# Patient Record
Sex: Male | Born: 1945 | Hispanic: No | Marital: Single | Smoking: Former smoker
Health system: Southern US, Community
[De-identification: ages and names within clinical notes are randomized; demographics above are authoritative.]

## PROBLEM LIST (undated history)

## (undated) DIAGNOSIS — R531 Weakness: Secondary | ICD-10-CM

## (undated) DIAGNOSIS — IMO0001 Reserved for inherently not codable concepts without codable children: Secondary | ICD-10-CM

## (undated) DIAGNOSIS — G4730 Sleep apnea, unspecified: Secondary | ICD-10-CM

## (undated) DIAGNOSIS — I10 Essential (primary) hypertension: Secondary | ICD-10-CM

## (undated) HISTORY — DX: Essential (primary) hypertension: I10

## (undated) HISTORY — PX: KNEE SURGERY: SHX244

## (undated) HISTORY — PX: TONSILLECTOMY: SUR1361

## (undated) HISTORY — PX: GANGLION CYST EXCISION: SHX1691

---

## 2008-08-17 ENCOUNTER — Ambulatory Visit (HOSPITAL_COMMUNITY): Admission: EM | Admit: 2008-08-17 | Discharge: 2008-08-19 | Payer: Self-pay | Admitting: Emergency Medicine

## 2008-08-17 ENCOUNTER — Encounter (INDEPENDENT_AMBULATORY_CARE_PROVIDER_SITE_OTHER): Payer: Self-pay | Admitting: Internal Medicine

## 2010-04-09 LAB — HEMOGLOBIN A1C: Mean Plasma Glucose: 117 mg/dL

## 2010-04-09 LAB — COMPREHENSIVE METABOLIC PANEL
ALT: 17 U/L (ref 0–53)
AST: 26 U/L (ref 0–37)
Albumin: 3.1 g/dL — ABNORMAL LOW (ref 3.5–5.2)
Alkaline Phosphatase: 33 U/L — ABNORMAL LOW (ref 39–117)
BUN: 17 mg/dL (ref 6–23)
Chloride: 105 mEq/L (ref 96–112)
Potassium: 3.7 mEq/L (ref 3.5–5.1)
Sodium: 140 mEq/L (ref 135–145)
Total Bilirubin: 0.8 mg/dL (ref 0.3–1.2)

## 2010-04-09 LAB — T4, FREE: Free T4: 0.85 ng/dL (ref 0.80–1.80)

## 2010-04-09 LAB — CK TOTAL AND CKMB (NOT AT ARMC): Relative Index: 2.7 — ABNORMAL HIGH (ref 0.0–2.5)

## 2010-04-09 LAB — CBC
HCT: 38.5 % — ABNORMAL LOW (ref 39.0–52.0)
Hemoglobin: 13.1 g/dL (ref 13.0–17.0)
Platelets: 217 10*3/uL (ref 150–400)
RBC: 4.41 MIL/uL (ref 4.22–5.81)
WBC: 6.9 10*3/uL (ref 4.0–10.5)
WBC: 7.5 10*3/uL (ref 4.0–10.5)

## 2010-04-09 LAB — CARDIAC PANEL(CRET KIN+CKTOT+MB+TROPI): Relative Index: 3.2 — ABNORMAL HIGH (ref 0.0–2.5)

## 2010-04-09 LAB — RAPID URINE DRUG SCREEN, HOSP PERFORMED
Barbiturates: NOT DETECTED
Benzodiazepines: NOT DETECTED
Cocaine: NOT DETECTED

## 2010-04-09 LAB — MRSA PCR SCREENING: MRSA by PCR: NEGATIVE

## 2010-04-09 LAB — BASIC METABOLIC PANEL
Calcium: 8.7 mg/dL (ref 8.4–10.5)
Chloride: 104 mEq/L (ref 96–112)
Creatinine, Ser: 0.93 mg/dL (ref 0.4–1.5)
GFR calc Af Amer: >60 mL/min (ref >60)

## 2010-04-09 LAB — LIPID PANEL
LDL Cholesterol: 123 mg/dL — ABNORMAL HIGH (ref 0–99)
VLDL: 14 mg/dL (ref 0–40)

## 2010-04-09 LAB — GLUCOSE, CAPILLARY
Glucose-Capillary: 100 mg/dL — ABNORMAL HIGH (ref 70–99)
Glucose-Capillary: 128 mg/dL — ABNORMAL HIGH (ref 70–99)
Glucose-Capillary: 196 mg/dL — ABNORMAL HIGH (ref 70–99)
Glucose-Capillary: 83 mg/dL (ref 70–99)
Glucose-Capillary: 99 mg/dL (ref 70–99)

## 2010-04-09 LAB — VITAMIN B12: Vitamin B-12: 673 pg/mL (ref 211–911)

## 2010-04-09 LAB — TROPONIN I: Troponin I: 0.02 ng/mL (ref 0.00–0.06)

## 2010-04-09 LAB — HEPATIC FUNCTION PANEL
AST: 33 U/L (ref 0–37)
Albumin: 3.6 g/dL (ref 3.5–5.2)

## 2010-04-10 LAB — COMPREHENSIVE METABOLIC PANEL
ALT: 21 U/L (ref 0–53)
AST: 32 U/L (ref 0–37)
Alkaline Phosphatase: 40 U/L (ref 39–117)
CO2: 24 mEq/L (ref 19–32)
Chloride: 101 mEq/L (ref 96–112)
GFR calc Af Amer: >60 mL/min (ref >60)
GFR calc non Af Amer: >60 mL/min (ref >60)
Glucose, Bld: 109 mg/dL — ABNORMAL HIGH (ref 70–99)
Sodium: 132 mEq/L — ABNORMAL LOW (ref 135–145)
Total Bilirubin: 0.6 mg/dL (ref 0.3–1.2)

## 2010-04-10 LAB — DIFFERENTIAL
Basophils Absolute: 0.1 10*3/uL (ref 0.0–0.1)
Basophils Relative: 2 % — ABNORMAL HIGH (ref 0–1)
Eosinophils Absolute: 0.5 10*3/uL (ref 0.0–0.7)
Eosinophils Relative: 7 % — ABNORMAL HIGH (ref 0–5)
Neutrophils Relative %: 44 % (ref 43–77)

## 2010-04-10 LAB — CBC
HCT: 42.1 % (ref 39.0–52.0)
MCHC: 34.1 g/dL (ref 30.0–36.0)
MCV: 92.5 fL (ref 78.0–100.0)
Platelets: 255 10*3/uL (ref 150–400)
RBC: 4.55 MIL/uL (ref 4.22–5.81)
WBC: 7.9 10*3/uL (ref 4.0–10.5)

## 2010-04-10 LAB — PROTIME-INR
INR: 1 (ref 0.00–1.49)
Prothrombin Time: 13.1 seconds (ref 11.6–15.2)

## 2010-04-10 LAB — POCT CARDIAC MARKERS
CKMB, poc: 2.8 ng/mL (ref 1.0–8.0)
Myoglobin, poc: 75.4 ng/mL (ref 12–200)
Troponin i, poc: <0.05 ng/mL (ref 0.00–0.09)

## 2010-04-10 LAB — GLUCOSE, CAPILLARY: Glucose-Capillary: 104 mg/dL — ABNORMAL HIGH (ref 70–99)

## 2010-04-10 LAB — CK TOTAL AND CKMB (NOT AT ARMC): Total CK: 184 U/L (ref 7–232)

## 2010-05-17 NOTE — Consult Note (Signed)
NAME:  STRATTON, VILLWOCK NO.:  [id number]   MEDICAL RECORD NO.:  [id number]          PATIENT TYPE:  EMS   LOCATION:  MAJO                         FACILITY:  [facility]   PHYSICIAN:  Noel Christmas, MD    DATE OF BIRTH:  June 05, 1945   DATE OF CONSULTATION:  08/16/2008  DATE OF DISCHARGE:                                 CONSULTATION   REFERRING PHYSICIAN:  Azalia Bilis, MD, [facility].   REASON FOR CONSULTATION:  Rule out TIA versus stroke presenting as code  stroke.   HISTORY OF PRESENT ILLNESS:  This is a 65 year old man who experienced  onset of tingling around the left side of his mouth as well as a feeling  of weakness involving all 4 extremities.  At one point, he was unable to  stand.  His arms felt heavy as well.  There was no loss of  consciousness.  He denies feeling lightheaded, no dizzy.  There was no  headache.  No apparent change in mental status.  On arrival in the  emergency room, his findings were apparently nonfocal other than concern  about left upper extremity weakness.  CT scan of his head showed no  acute intracranial abnormality.  Blood pressure was markedly elevated  with systolic blood pressure of 240.  Symptoms completely cleared  following CT scan and returned to the emergency room.  There has been no  recurrence of symptoms.  At no time did he have preponderance of  weakness on one side compared to the other.   Past medical history is remarkable for hypertension, which was diagnosed  about 3 months ago.  He is on lisinopril 10 mg per day.   His family history is positive for hypertension but negative for stroke.   PHYSICAL EXAMINATION:  Appearance was that of a late middle-aged-  appearing man of medium build who was alert and cooperative in no acute  distress.  His mental status was normal.  Pupils, extraocular movements,  and visual fields were normal.  There was no facial weakness.  There was  also no residual facial  numbness.  Hearing was normal.  Speech and  palatal movement were normal.  Strength and muscle tone were normal  throughout.  Deep tendon reflexes were normal and symmetrical.  Plantar  responses were flexor.  Sensory examination was normal.  Coordination  was normal.  Carotid auscultation was normal.   CLINICAL IMPRESSION:  1. Unclear etiology for a period of generalized weakness.  The      patient's only focal symptoms was mild tingling around left side of      his mouth with no tingling elsewhere including left hand.  2. Transient ischemic attack is unlikely with no symptoms that could      be attributed to a specific cerebrovascular territory.   RECOMMENDATIONS:  1. Aspirin 81 mg per day.  2. Blood pressure control per Primary Care Service.  3. Neurology followup p.r.n.   Thank you for asking me to evaluate Mr. Muilenburg.      Noel Christmas, MD  Electronically Signed  CS/MEDQ  D:  08/16/2008  T:  08/17/2008  Job:  [id number]

## 2010-05-17 NOTE — Discharge Summary (Signed)
NAME:  Kenneth Dyer, Kenneth Dyer NO.:  [id number]   MEDICAL RECORD NO.:  [id number]          PATIENT TYPE:  INP   LOCATION:  2025                         FACILITY:  [facility]   PHYSICIAN:  Altha Harm, MDDATE OF BIRTH:  05/26/1945   DATE OF ADMISSION:  08/16/2008  DATE OF DISCHARGE:  08/19/2008                               DISCHARGE SUMMARY   DISCHARGE DISPOSITION:  Home.   FINAL DISCHARGE DIAGNOSES:  1. Malignant hypertension, resolved.  2. Sinus bradycardia, asymptomatic.  3. Hypertension.  4. Hyperlipidemia.  5. Anxiety.   DISCHARGE MEDICATIONS:  1. Lisinopril 10 mg p.o. daily.  2. Hydrochlorothiazide 12.5 mg p.o. daily.  3. Zocor 20 mg p.o. q.h.s.   CONSULTANTS:  Neurology.   PROCEDURE:  None.   DIAGNOSTIC STUDIES:  1. CT of the head without contrast done on admission which shows no      acute intracranial abnormality.  2. Portable chest x-ray one-view done on admission which shows      cardiomegaly.  3. CT angiogram of the chest done on August 16, 2008 which shows no      acute abnormality in the chest.  Impression:  Old granulomatous      disease.  4. 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  5. Bilateral carotid duplex which shows no ICA stenosis bilaterally.  6. A 2-D echocardiogram which shows left ventricular cavity size      normal and systolic function vigorous with an ejection fraction of      65-70%.  No diagnostic regional wall motion abnormality identified      and trivial regurgitation noted at the aortic valve.   CODE STATUS:  Full code.   ALLERGIES:  NO KNOWN DRUG ALLERGIES.   PRIMARY CARE PHYSICIAN:  The patient's physician is in [place].   CHIEF COMPLAINT:  Numbness in both arms and legs.   HISTORY OF PRESENT ILLNESS:  Please  refer to the H and P by Dr. Della Goo for details of the HPI.  However in short, this is a gentleman  who presents to the emergency department with complaints of heaviness  and numbness in both arms and legs.  The patient was found to have a  blood pressure of 224/104 in the emergency room.   HOSPITAL COURSE:  1. Malignant hypertension.  The patient was started on IV labetalol      and admitted to the ICU.  The patient's blood pressure improved and      he was transferred over to p.o. metoprolol.  The patient was also      started back on his usual medication of lisinopril.  This rendered      the patient's blood pressure as low as 109 systolic and heart rate      down into the 40s.  I felt that the patient's blood pressure was  lowered excessively with this dose of metoprolol, although it was a      small dose, the metoprolol was discontinued and the patient      observed for 24 hours.  The patient has a blood pressure now      ranging 149/91 with heart rates in the 60s.  The patient does have      bradycardia down into the high 40s and low 50s when sleeping.      However, this is completely asymptomatic.  Thus the patient is      being sent home with the medications as noted above.      Hydrochlorothiazide has been added for natruresis to aid in control      of blood pressure.  His medications will need to be further      titrated as an outpatient.  2. Hyperlipidemia.  The patient was found to have elevated lipids with      an LDL of 123.  He has been started on Zocor 20 mg p.o. q.h.s.  3. Sinus bradycardia.  As noted above, I felt that the patient likely      has underlying  sinus bradycardia.  He states that he exercises regularly and is known  to have heart rates down into the 50s and 40s.  This was likely  compounded by the use of a beta blocker.  The beta blocker has been  discontinued and the patient is asymptomatic with his bradycardia.  I  will ask the patient to  follow up with his primary care physician within  a week for evaluation of his heart rate and bradycardia.   DIETARY RESTRICTIONS:  The patient should be on a 2 gm sodium, low  cholesterol diet.   ACTIVITY RESTRICTIONS:  None.   FOLLOW UP:  The patient to follow up with his primary care physician in  1 week in [place].   Total time for this discharge 47 minutes.      Altha Harm, MD  Electronically Signed     MAM/MEDQ  D:  08/19/2008  T:  08/19/2008  Job:  [id number]

## 2010-05-17 NOTE — H&P (Signed)
NAME:  Kenneth Dyer, Kenneth Dyer NO.:  [id number]   MEDICAL RECORD NO.:  [id number]          PATIENT TYPE:  INP   LOCATION:  2308                         FACILITY:  [facility]   PHYSICIAN:  Della Goo, M.D. DATE OF BIRTH:  September 17, 1945   DATE OF ADMISSION:  08/16/2008  DATE OF DISCHARGE:                              HISTORY & PHYSICAL   PRIMARY CARE PHYSICIAN:  Unassigned.   CHIEF COMPLAINT:  Numbness in both arms and legs.   HISTORY OF PRESENT ILLNESS:  This is a 65 year old male who presents to  the emergency department secondary to complaints of heaviness and  numbness in both legs and in both arms which started at about 8 p.m..  The patient reports being weak and being unable to walk.  He was at an  area restaurant when this occurred.  The patient also reported having  numbness and tingling of his mouth area.  Emergency medical services  were called and the patient was transported to the emergency department  at [facility].  On arrival. the patient was found to have an  elevated blood pressure of 224/104.  He was administered labetalol IV  and placed on a labetalol drip.  His blood pressures improved.  The  patient reports having a history of hypertension and he reports taking  lisinopril therapy.  The patient denies having any chest pain, shortness  of breath.  Denies having any nausea, vomiting or diaphoresis.  He  reports the sensation of numbness and heaviness lasted about an hour and  a half, resolved while he was in the emergency department.  The patient  was initially evaluated by the neurologist as a Code Stroke.  This was  ruled out and the patient was referred for medical admission.  A CT scan  of the head had been performed which revealed no acute intracranial  abnormality.  A CT angiogram of the chest was also performed to evaluate  for possible dissection secondary to the patient's symptoms.  The CT  angiogram of the chest was negative for an  aneurysm or a dissection.   PAST MEDICAL HISTORY:  Hypertension.   PAST SURGICAL HISTORY:  Tonsillectomy.   MEDICATIONS:  Lisinopril 10 mg one p.o. daily   ALLERGIES:  No known drug allergies.   SOCIAL HISTORY:  The patient lives alone.  He is a nonsmoker,  nondrinker.  He denies any illicit drug usage.   FAMILY HISTORY:  Positive for hypertension in his father, positive for  skin cancer in his father.  No history of coronary artery disease or  diabetes in the family that he knows of.  He states his mother had  rheumatoid arthritis.   REVIEW OF SYSTEMS:  Pertinents are mentioned above in the HPI.   PHYSICAL EXAMINATION:  FINDINGS:  This is a 65 year old well-nourished,  well-developed male in no visible discomfort or acute distress.  VITAL SIGNS:  Temperature 98.3, blood pressure initially 224/104, heart  rate 74, respirations 12, O2 saturation 100%.  HEENT EXAMINATION:  Normocephalic, atraumatic.  Pupils equally round,  reactive to light.  Extraocular movements are intact.  Funduscopic  benign.  There is no scleral icterus.  Nares are patent bilaterally.  Oropharynx is clear.  NECK:  Supple, full range of motion.  No thyromegaly, adenopathy,  jugular venous distention.  CARDIOVASCULAR:  Regular rate and rhythm.  No murmurs, gallops or rubs.  LUNGS: Clear to auscultation bilaterally.  ABDOMEN: Positive bowel sounds, soft, nontender, nondistended.  EXTREMITIES: Without cyanosis, clubbing or edema.  NEUROLOGIC EXAMINATION:  The patient is alert and oriented x3.  His  speech is clear.  His cranial nerves are intact.  Motor and sensory  function also intact.  He has full range of motion of all of his  extremities and his strength is 5/5 throughout.  His cerebellar function  is intact.  Gait has not been assessed.  There are no focal deficits on  examination at this time.   LABORATORY STUDIES:  Protime 13.1, INR 1.0, PTT 25.  White blood cell  count 7.9, hemoglobin 14.4,  hematocrit 42.1, MCV 92.5, platelets 255,  neutrophils 44%, lymphocytes 39%, glucose 104.  Point of care cardiac  markers with a myoglobin of 75.4, CK-MB 2.8, troponin less than 0.05.  CT scan of the head results as mentioned above.  CT angiogram study of  the chest mentioned above in the HPI as well.  A portable chest x-ray  reveals mild cardiomegaly, no airspace disease or effusion.  The  mediastinum is within normal limits.  Mild left basilar atelectasis is  present.  EKG reveals normal sinus rhythm.  No acute ST-segment changes  seen.   ASSESSMENT:  A 65 year old male being admitted with:  1. Hypertensive urgency.  2. Transient ischemic attack.  3. Weakness.   PLAN:  The patient will be continued on the IV labetalol drip to control  as blood pressure at this time.  Cardiac enzymes will be performed and  the patient will be sent for an MRI/MRA study and carotid ultrasound  study.  DVT and GI prophylaxis have been ordered.  Further workup will  ensue pending results of the patient's clinical course and results of  his studies.      Della Goo, M.D.  Electronically Signed     HJ/MEDQ  D:  08/17/2008  T:  08/17/2008  Job:  [id number]

## 2013-05-19 ENCOUNTER — Ambulatory Visit (HOSPITAL_BASED_OUTPATIENT_CLINIC_OR_DEPARTMENT_OTHER): Payer: Medicare HMO | Attending: Internal Medicine

## 2013-05-19 VITALS — Ht 70.0 in | Wt 205.0 lb

## 2013-05-19 DIAGNOSIS — G4730 Sleep apnea, unspecified: Secondary | ICD-10-CM

## 2013-05-19 DIAGNOSIS — G4733 Obstructive sleep apnea (adult) (pediatric): Secondary | ICD-10-CM | POA: Insufficient documentation

## 2013-05-19 DIAGNOSIS — G4710 Hypersomnia, unspecified: Secondary | ICD-10-CM

## 2013-05-24 DIAGNOSIS — G4730 Sleep apnea, unspecified: Secondary | ICD-10-CM

## 2013-05-24 DIAGNOSIS — G4710 Hypersomnia, unspecified: Secondary | ICD-10-CM

## 2013-05-24 NOTE — Sleep Study (Signed)
   NAME: Kenneth Dyer DATE OF BIRTH:  06/08/1945 MEDICAL RECORD NUMBER [id number]  LOCATION: [facility]  PHYSICIAN: Stassi Fadely D Puneet Selden  DATE OF STUDY: 05/19/2013  SLEEP STUDY TYPE: Nocturnal Polysomnogram               REFERRING PHYSICIAN: Jetty Duhamel D, MD  INDICATION FOR STUDY: Hypersomnia with sleep apnea  EPWORTH SLEEPINESS SCORE:   8/24 HEIGHT: 5\' 10"  (177.8 cm)  WEIGHT: 205 lb (92.987 kg)    Body mass index is 29.41 kg/(m^2).  NECK SIZE: 15 in.  MEDICATIONS: Charted for review  SLEEP ARCHITECTURE: Split study protocol. During the diagnostic phase, total sleep time 129.5 minutes with sleep efficiency 90.6%. Stage I was 2.3%, stage II 97.7%, stage III and REM were absent. Sleep latency 1.5 minutes, awake after sleep onset 11.5 minutes, arousal index 0.9. Bedtime medication: None  RESPIRATORY DATA: Apnea hypopnea index (AHI) 67.2 per hour. 145 total events scored including 85 obstructive apneas, 1 central apnea, 59 hypopneas. Events were associated with supine sleep position. CPAP was titrated to 12 CWP, AHI 0 per hour. He wore a medium Simplus fullface mask with heated humidifier.  OXYGEN DATA: Loud snoring before CPAP with oxygen desaturation to a nadir of 84% on room air. After CPAP, snoring was prevented and mean oxygen saturation of 94.8% on room air.  CARDIAC DATA: Sinus rhythm with occasional PAC  MOVEMENT/PARASOMNIA: No significant movement disturbance, bathroom x3  IMPRESSION/ RECOMMENDATION:   1) Severe obstructive sleep apnea/hypopneas syndrome, AHI 67.2 per hour, supine events. Loud snoring with oxygen desaturation to a nadir of 84% on room air. 2) Successful CPAP titration to 12 CWP, AHI 0 per hour. He wore a medium [employer] Simplus fullface mask with heated humidifier. Snoring was prevented and mean oxygen saturation of 94.8% on room air.   Signed Jetty Duhamel M.D. Waymon Budge Diplomate, Biomedical engineer of Sleep  Medicine  ELECTRONICALLY SIGNED ON:  05/24/2013, 2:56 PM [name] PH: [phone]   FX: [phone] ACCREDITED BY THE AMERICAN ACADEMY OF SLEEP MEDICINE

## 2013-07-29 ENCOUNTER — Encounter: Payer: Self-pay | Admitting: Pulmonary Disease

## 2013-07-29 ENCOUNTER — Ambulatory Visit (INDEPENDENT_AMBULATORY_CARE_PROVIDER_SITE_OTHER): Payer: Medicare HMO | Admitting: Pulmonary Disease

## 2013-07-29 ENCOUNTER — Encounter (INDEPENDENT_AMBULATORY_CARE_PROVIDER_SITE_OTHER): Payer: Self-pay

## 2013-07-29 VITALS — BP 110/68 | HR 61 | Temp 98.0°F | Ht 70.0 in | Wt 205.6 lb

## 2013-07-29 DIAGNOSIS — G4733 Obstructive sleep apnea (adult) (pediatric): Secondary | ICD-10-CM | POA: Insufficient documentation

## 2013-07-29 NOTE — Progress Notes (Signed)
Subjective:    Patient ID: Kenneth Dyer, male    DOB: 03/29/1945, 68 y.o.   MRN: [id number]  HPI  68 year old man presents for evaluation of sleep apnea. [place]CP is in [place], and he is trying to find a new provider in [place]. He reports chronic a.m. Headaches, for which he is required BurtonGoody powder for many years. He reports difficulty breathing when he is sleeping and over the years he started sleeping on the couch, and then with 4 pillows. He shared a room recently in a hotel with a roommate and was told about his loud snoring and gasping episodes during sleep. Epworth sleepiness score is 7 Bedtime is around 11 PM, sleep latency is minimal, he sleeps on his back with 4 pillows, reports frequent nocturnal awakenings especially if his sinuses bother him, occasional bathroom visits, wakes up around 7 AM with a headache and extreme dryness of mouth, he feels like he is in a fog for about 30 minutes. He a tonsillectomy as a child. There is no history suggestive of cataplexy, sleep paralysis or parasomnias Polysomnogram with weight 205 pounds and BMI 29 showed AHI 67 events per hour with lowest desaturation to 84%. CPAP was titrated to 12 centimeters with a medium fullface mask to abolish  Snoring. He felt claustrophobic and did not feel rested after this night.  Past Medical History  Diagnosis Date  . Hypertension     Past Surgical History  Procedure Laterality Date  . Knee surgery      right   . Ganglion cyst excision      right wrist  . Tonsillectomy      No Known Allergies  History   Social History  . Marital Status: Single    Spouse Name: N/A    Number of Children: N/A  . Years of Education: N/A   Occupational History  . retired    Social History Main Topics  . Smoking status: Former Smoker -- 1.00 packs/day for 15 years    Types: Cigarettes    Quit date: 01/03/1983  . Smokeless tobacco: Not on file  . Alcohol Use: No  . Drug Use: No  . Sexual Activity: Not  on file   Other Topics Concern  . Not on file   Social History Narrative  . No narrative on file    Family History  Problem Relation Age of Onset  . Hypertension Father   . Skin cancer Father   . Arthritis Mother        Review of Systems  Constitutional: Negative for fever and unexpected weight change.  HENT: Negative for congestion, dental problem, ear pain, nosebleeds, postnasal drip, rhinorrhea, sinus pressure, sneezing, sore throat and trouble swallowing.   Eyes: Negative for redness and itching.  Respiratory: Positive for shortness of breath. Negative for cough, chest tightness and wheezing.   Cardiovascular: Negative for palpitations and leg swelling.  Gastrointestinal: Negative for nausea and vomiting.  Genitourinary: Negative for dysuria.  Musculoskeletal: Negative for joint swelling.  Skin: Negative for rash.  Neurological: Negative for headaches.  Hematological: Does not bruise/bleed easily.  Psychiatric/Behavioral: Negative for dysphoric mood. The patient is not nervous/anxious.        Objective:   Physical Exam  Gen. Pleasant, well-nourished, in no distress, normal affect ENT - no lesions, no post nasal drip Neck: No JVD, no thyromegaly, no carotid bruits Lungs: no use of accessory muscles, no dullness to percussion, clear without rales or rhonchi  Cardiovascular: Rhythm regular, heart sounds  normal, no murmurs or gallops, no peripheral edema Abdomen: soft and non-tender, no hepatosplenomegaly, BS normal. Musculoskeletal: No deformities, no cyanosis or clubbing Neuro:  alert, non focal       Assessment & Plan:

## 2013-07-29 NOTE — Assessment & Plan Note (Signed)
The pathophysiology of obstructive sleep apnea , it's cardiovascular consequences & modes of treatment including CPAP were discused with the patient in detail & they evidenced understanding.  Weight loss encouraged, compliance with goal of at least 4-6 hrs every night is the expectation. Advised against medications with sedative side effects Cautioned against driving when sleepy - understanding that sleepiness will vary on a day to day basis Set up CPAP 12 cm, mask of choice, download in 4 wks

## 2013-07-29 NOTE — Patient Instructions (Signed)
You have severe obstructive sleep apnea We will set you up with a cpap machine

## 2013-08-13 ENCOUNTER — Telehealth: Payer: Self-pay | Admitting: Pulmonary Disease

## 2013-08-13 DIAGNOSIS — G4733 Obstructive sleep apnea (adult) (pediatric): Secondary | ICD-10-CM

## 2013-08-13 NOTE — Telephone Encounter (Signed)
Pt calling again Spoke with patient who verified that he would like his [facility] than [facility], where the order ws originally sent on 7.28.15.  Advised pt order will be placed.  Nothing further needed at this time; will sign off.

## 2013-08-13 NOTE — Telephone Encounter (Signed)
Pt returning [name] ° °

## 2013-08-13 NOTE — Telephone Encounter (Signed)
Order 07/29/13 by RA was faxed to [facility]. Called pt home # [id number] x1 for pt to confirm message

## 2013-09-10 ENCOUNTER — Ambulatory Visit: Payer: Medicare HMO | Admitting: Pulmonary Disease

## 2013-10-09 ENCOUNTER — Ambulatory Visit: Payer: Medicare HMO | Admitting: Pulmonary Disease

## 2013-10-10 ENCOUNTER — Ambulatory Visit (INDEPENDENT_AMBULATORY_CARE_PROVIDER_SITE_OTHER): Payer: Medicare HMO | Admitting: Pulmonary Disease

## 2013-10-10 ENCOUNTER — Encounter: Payer: Self-pay | Admitting: Pulmonary Disease

## 2013-10-10 VITALS — BP 118/78 | HR 68 | Temp 97.2°F | Ht 70.0 in | Wt 212.8 lb

## 2013-10-10 DIAGNOSIS — G4733 Obstructive sleep apnea (adult) (pediatric): Secondary | ICD-10-CM

## 2013-10-10 NOTE — Assessment & Plan Note (Signed)
Your CPAP is set at 12 cm You are adjusting well compliance with goal of at least 6 hrs every night is the expectation. Advised against medications with sedative side effects Cautioned against driving when sleepy - understanding that sleepiness will vary on a day to day basis

## 2013-10-10 NOTE — Patient Instructions (Addendum)
Your CPAP is set at 12 cm You are adjusting well compliance with goal of at least 6 hrs every night is the expectation. Advised against medications with sedative side effects Cautioned against driving when sleepy - understanding that sleepiness will vary on a day to day basis 

## 2013-10-10 NOTE — Progress Notes (Signed)
   Subjective:    Patient ID: Lezlie LyeJames Farner, male    DOB: 09/19/1945, 68 y.o.   MRN: [id number]  HPI  68 year old man for FU of OSA.  He presented with loud snoring and gasping episodes during sleep.  Epworth sleepiness score is 7  He had a tonsillectomy as a child.   Polysomnogram with weight 205 pounds and BMI 29 showed AHI 67 events per hour with lowest desaturation to 84%. CPAP was titrated to 12 centimeters with a medium fullface mask to abolish Snoring. He felt claustrophobic and did not feel rested after this night.  Download 09/2013 - AHI 7/h on 12 cm, leak ok, good usage avg 6h  Feels rested, CPAP helping, has good energy  Review of Systems neg for any significant sore throat, dysphagia, itching, sneezing, nasal congestion or excess/ purulent secretions, fever, chills, sweats, unintended wt loss, pleuritic or exertional cp, hempoptysis, orthopnea pnd or change in chronic leg swelling. Also denies presyncope, palpitations, heartburn, abdominal pain, nausea, vomiting, diarrhea or change in bowel or urinary habits, dysuria,hematuria, rash, arthralgias, visual complaints, headache, numbness weakness or ataxia.     Objective:   Physical Exam  Gen. Pleasant, well-nourished, in no distress ENT - no lesions, no post nasal drip Neck: No JVD, no thyromegaly, no carotid bruits Lungs: no use of accessory muscles, no dullness to percussion, clear without rales or rhonchi  Cardiovascular: Rhythm regular, heart sounds  normal, no murmurs or gallops, no peripheral edema Musculoskeletal: No deformities, no cyanosis or clubbing        Assessment & Plan:

## 2014-01-20 ENCOUNTER — Telehealth: Payer: Self-pay | Admitting: Pulmonary Disease

## 2014-01-20 NOTE — Telephone Encounter (Signed)
Called and spoke to pt. Pt is needing CPAP supplies but stated he is unable to due to the [facility]. [facility] and [facility] and was informed when the pt changed DME companies [facility] in [facility] no documents were sent to [facility] (OV notes, sleep study, etc). [facility] stated they have the order for pt's CPAP settings but nothing else.  PCC's please advise.

## 2014-01-20 NOTE — Telephone Encounter (Signed)
i don't know what this pt can be talking about [facility] has access to EPIC they can get what they need i guess we need to get melissa involved in this Tobe SosSally E Ottinger

## 2014-01-21 NOTE — Telephone Encounter (Signed)
Staff [facility]. Advised pt that we would get this handled for him.

## 2014-01-22 NOTE — Telephone Encounter (Signed)
Spoke with pt and informed that [facility] needed and should be sending out cpap supplies.

## 2014-01-22 NOTE — Telephone Encounter (Signed)
ATC pt NA wcb 

## 2014-01-22 NOTE — Telephone Encounter (Signed)
Return [name] °

## 2014-01-22 NOTE — Telephone Encounter (Signed)
[facility] and she states that shew as able to get the info she needed and forwarded this to the cpap supply team.  She will check with them to see if they have sent supplies out and pt should be receiving them. ATC pt to inform him of this but NA - Will try back later.

## 2014-01-22 NOTE — Telephone Encounter (Signed)
Pt retunred call [phone]

## 2014-02-12 ENCOUNTER — Encounter: Payer: Self-pay | Admitting: Adult Health

## 2014-02-12 ENCOUNTER — Encounter (INDEPENDENT_AMBULATORY_CARE_PROVIDER_SITE_OTHER): Payer: Self-pay

## 2014-02-12 ENCOUNTER — Ambulatory Visit (INDEPENDENT_AMBULATORY_CARE_PROVIDER_SITE_OTHER): Payer: Medicare HMO | Admitting: Adult Health

## 2014-02-12 VITALS — BP 144/84 | HR 63 | Temp 98.4°F | Ht 70.0 in | Wt 216.0 lb

## 2014-02-12 DIAGNOSIS — G4733 Obstructive sleep apnea (adult) (pediatric): Secondary | ICD-10-CM

## 2014-02-12 NOTE — Patient Instructions (Signed)
Continue on C Pap at bedtime Continue to work on weight loss Do not drive when sleepy Follow-up with Dr. Vassie LollAlva in 1 year and as needed

## 2014-02-12 NOTE — Progress Notes (Signed)
   Subjective:    Patient ID: Kenneth Dyer, male    DOB: 11/13/1945, 69 y.o.   MRN: [id number]  HPI 69 year old man for FU of OSA.  He presented with loud snoring and gasping episodes during sleep.  Epworth sleepiness score is 7  He had a tonsillectomy as a child.   Polysomnogram with weight 205 pounds and BMI 29 showed AHI 67 events per hour with lowest desaturation to 84%. CPAP was titrated to 12 centimeters with a medium fullface mask to abolish Snoring. He felt claustrophobic and did not feel rested after this night.  Download 09/2013 - AHI 7/h on 12 cm, leak ok, good usage avg 6h  Feels rested, CPAP helping, has good energy  02/12/2014 follow-up sleep apnea Patient returns for follow-up of underlying severe sleep apnea He was started on C Pap in August 2015 Has been doing very well on C Pap he wears it every night. Says he has good control with no daytime sleepiness. Download October 13 2 February 19 shows excellent compliance with 100% usage with average of 7.5 hours on 12 cm, AHI  4.7, leaks okay Patient denies any chest pain, palpitations, extremity swelling or shortness of breath.  Review of Systems  neg for any significant sore throat, dysphagia, itching, sneezing, nasal congestion or excess/ purulent secretions, fever, chills, sweats, unintended wt loss, pleuritic or exertional cp, hempoptysis, orthopnea pnd or change in chronic leg swelling. Also denies presyncope, palpitations, heartburn, abdominal pain, nausea, vomiting, diarrhea or change in bowel or urinary habits, dysuria,hematuria, rash, arthralgias, visual complaints, headache, numbness weakness or ataxia.     Objective:   Physical Exam   Gen. Pleasant, well-nourished, in no distress ENT - no lesions, no post nasal drip Neck: No JVD, no thyromegaly, no carotid bruits Lungs: no use of accessory muscles, no dullness to percussion, clear without rales or rhonchi  Cardiovascular: Rhythm regular, heart sounds  normal,  no murmurs or gallops, no peripheral edema Musculoskeletal: No deformities, no cyanosis or clubbing        Assessment & Plan:

## 2014-02-12 NOTE — Assessment & Plan Note (Signed)
Sleep apnea, controlled on C Pap He is encouraged on weight loss Advised to not drive if sleepy Follow up in one year with Dr. Vassie LollAlva and as needed

## 2014-02-14 NOTE — Progress Notes (Signed)
Reviewed & agree with plan  

## 2014-03-25 ENCOUNTER — Telehealth: Payer: Self-pay | Admitting: Pulmonary Disease

## 2014-03-25 DIAGNOSIS — G4733 Obstructive sleep apnea (adult) (pediatric): Secondary | ICD-10-CM

## 2014-03-25 NOTE — Telephone Encounter (Signed)
Patient would like to try the nasal mask.  Ok to send order?

## 2014-03-26 NOTE — Telephone Encounter (Signed)
ok 

## 2014-03-26 NOTE — Telephone Encounter (Signed)
Order entered. Pt notified. Nothing further needed.

## 2014-10-22 DIAGNOSIS — I10 Essential (primary) hypertension: Secondary | ICD-10-CM | POA: Diagnosis not present

## 2014-11-18 DIAGNOSIS — H52223 Regular astigmatism, bilateral: Secondary | ICD-10-CM | POA: Diagnosis not present

## 2014-11-18 DIAGNOSIS — H04123 Dry eye syndrome of bilateral lacrimal glands: Secondary | ICD-10-CM | POA: Diagnosis not present

## 2014-11-18 DIAGNOSIS — H524 Presbyopia: Secondary | ICD-10-CM | POA: Diagnosis not present

## 2014-11-18 DIAGNOSIS — H5203 Hypermetropia, bilateral: Secondary | ICD-10-CM | POA: Diagnosis not present

## 2014-11-18 DIAGNOSIS — H25813 Combined forms of age-related cataract, bilateral: Secondary | ICD-10-CM | POA: Diagnosis not present

## 2015-02-17 ENCOUNTER — Ambulatory Visit (INDEPENDENT_AMBULATORY_CARE_PROVIDER_SITE_OTHER): Payer: Medicare HMO | Admitting: Pulmonary Disease

## 2015-02-17 ENCOUNTER — Encounter: Payer: Self-pay | Admitting: Pulmonary Disease

## 2015-02-17 VITALS — BP 124/82 | HR 71 | Ht 70.0 in | Wt 214.4 lb

## 2015-02-17 DIAGNOSIS — G4733 Obstructive sleep apnea (adult) (pediatric): Secondary | ICD-10-CM | POA: Diagnosis not present

## 2015-02-17 NOTE — Assessment & Plan Note (Signed)
CPAP supplies will be renewed for a year Trial of new nasal mask Your CPAP is set at 12 cm  Weight loss encouraged, compliance with goal of at least 4-6 hrs every night is the expectation. Advised against medications with sedative side effects Cautioned against driving when sleepy - understanding that sleepiness will vary on a day to day basis   Greater than 50% time was spent in counseling and coordination of care with the patient

## 2015-02-17 NOTE — Patient Instructions (Signed)
CPAP supplies will be renewed for a year Trial of new nasal mask Your CPAP is set at 12 cm

## 2015-02-17 NOTE — Progress Notes (Signed)
   Subjective:    Patient ID: Kenneth Dyer, male    DOB: February 01, 1945, 70 y.o.   MRN: [id number]  HPI  70 year old man for FU of severe OSA.   PSG - weight 205 pounds and BMI 29 showed AHI 67 events per hour with lowest desaturation to 84%. CPAP was titrated to 12 centimeters with a medium fullface mask    Download 02/2014 shows excellent compliance with 100% usage with average of 7.5 hours on 12 cm, AHI  4.7,  02/17/2015  Chief Complaint  Patient presents with  . Follow-up    doing well on CPAP.  received a new mask few weeks ago, doing well on new mask. Discuss mask and headgear options.   Feels rested, CPAP helping, has good energy He was started on C Pap in August 2015 Has been doing very well on C Pap he wears it every night.   no daytime sleepiness.  Download 02/2015 reviewed on 12 cm-excellent usage more than 8 hours, residue AHI 5.6/hour, mild leak  Review of Systems neg for any significant sore throat, dysphagia, itching, sneezing, nasal congestion or excess/ purulent secretions, fever, chills, sweats, unintended wt loss, pleuritic or exertional cp, hempoptysis, orthopnea pnd or change in chronic leg swelling.  Also denies presyncope, palpitations, heartburn, abdominal pain, nausea, vomiting, diarrhea or change in bowel or urinary habits, dysuria,hematuria, rash, arthralgias, visual complaints, headache, numbness weakness or ataxia.     Objective:   Physical Exam  Gen. Pleasant, well-nourished, in no distress ENT - no lesions, no post nasal drip Neck: No JVD, no thyromegaly, no carotid bruits Lungs: no use of accessory muscles, no dullness to percussion, clear without rales or rhonchi  Cardiovascular: Rhythm regular, heart sounds  normal, no murmurs or gallops, no peripheral edema Musculoskeletal: No deformities, no cyanosis or clubbing        Assessment & Plan:

## 2015-02-24 ENCOUNTER — Encounter: Payer: Self-pay | Admitting: Pulmonary Disease

## 2015-03-05 ENCOUNTER — Telehealth: Payer: Self-pay | Admitting: Pulmonary Disease

## 2015-03-05 DIAGNOSIS — G4733 Obstructive sleep apnea (adult) (pediatric): Secondary | ICD-10-CM

## 2015-03-05 NOTE — Telephone Encounter (Signed)
Spoke with pt. [facility] is [facility]. Order has been placed. Nothing further was needed at this time.

## 2015-03-17 DIAGNOSIS — G4733 Obstructive sleep apnea (adult) (pediatric): Secondary | ICD-10-CM | POA: Diagnosis not present

## 2015-03-25 DIAGNOSIS — L578 Other skin changes due to chronic exposure to nonionizing radiation: Secondary | ICD-10-CM | POA: Diagnosis not present

## 2015-03-25 DIAGNOSIS — L570 Actinic keratosis: Secondary | ICD-10-CM | POA: Diagnosis not present

## 2015-06-03 DIAGNOSIS — IMO0001 Reserved for inherently not codable concepts without codable children: Secondary | ICD-10-CM

## 2015-06-03 DIAGNOSIS — R531 Weakness: Secondary | ICD-10-CM

## 2015-06-03 HISTORY — DX: Weakness: R53.1

## 2015-06-03 HISTORY — DX: Reserved for inherently not codable concepts without codable children: IMO0001

## 2015-06-23 DIAGNOSIS — E785 Hyperlipidemia, unspecified: Secondary | ICD-10-CM | POA: Diagnosis not present

## 2015-06-23 DIAGNOSIS — I10 Essential (primary) hypertension: Secondary | ICD-10-CM | POA: Diagnosis not present

## 2015-06-23 DIAGNOSIS — R0602 Shortness of breath: Secondary | ICD-10-CM | POA: Diagnosis not present

## 2015-06-23 DIAGNOSIS — Z13220 Encounter for screening for lipoid disorders: Secondary | ICD-10-CM | POA: Diagnosis not present

## 2015-06-23 DIAGNOSIS — R748 Abnormal levels of other serum enzymes: Secondary | ICD-10-CM | POA: Diagnosis not present

## 2015-06-29 DIAGNOSIS — R0602 Shortness of breath: Secondary | ICD-10-CM | POA: Diagnosis not present

## 2015-06-30 ENCOUNTER — Observation Stay (HOSPITAL_COMMUNITY)
Admission: EM | Admit: 2015-06-30 | Discharge: 2015-07-01 | Disposition: A | Discharge status: Home / Self Care | Payer: Medicare HMO | Attending: Cardiovascular Disease | Admitting: Cardiovascular Disease

## 2015-06-30 ENCOUNTER — Observation Stay (HOSPITAL_COMMUNITY): Payer: Medicare HMO

## 2015-06-30 ENCOUNTER — Encounter (HOSPITAL_COMMUNITY): Payer: Self-pay | Admitting: Emergency Medicine

## 2015-06-30 ENCOUNTER — Emergency Department (HOSPITAL_COMMUNITY): Payer: Medicare HMO

## 2015-06-30 DIAGNOSIS — G4733 Obstructive sleep apnea (adult) (pediatric): Secondary | ICD-10-CM | POA: Insufficient documentation

## 2015-06-30 DIAGNOSIS — J8410 Pulmonary fibrosis, unspecified: Secondary | ICD-10-CM | POA: Insufficient documentation

## 2015-06-30 DIAGNOSIS — J449 Chronic obstructive pulmonary disease, unspecified: Secondary | ICD-10-CM | POA: Diagnosis not present

## 2015-06-30 DIAGNOSIS — R072 Precordial pain: Principal | ICD-10-CM | POA: Insufficient documentation

## 2015-06-30 DIAGNOSIS — I249 Acute ischemic heart disease, unspecified: Secondary | ICD-10-CM | POA: Diagnosis not present

## 2015-06-30 DIAGNOSIS — R0602 Shortness of breath: Secondary | ICD-10-CM | POA: Diagnosis not present

## 2015-06-30 DIAGNOSIS — E785 Hyperlipidemia, unspecified: Secondary | ICD-10-CM | POA: Insufficient documentation

## 2015-06-30 DIAGNOSIS — K219 Gastro-esophageal reflux disease without esophagitis: Secondary | ICD-10-CM | POA: Insufficient documentation

## 2015-06-30 DIAGNOSIS — E6609 Other obesity due to excess calories: Secondary | ICD-10-CM | POA: Diagnosis not present

## 2015-06-30 DIAGNOSIS — I2510 Atherosclerotic heart disease of native coronary artery without angina pectoris: Secondary | ICD-10-CM | POA: Insufficient documentation

## 2015-06-30 DIAGNOSIS — Z7982 Long term (current) use of aspirin: Secondary | ICD-10-CM | POA: Diagnosis not present

## 2015-06-30 DIAGNOSIS — E669 Obesity, unspecified: Secondary | ICD-10-CM | POA: Insufficient documentation

## 2015-06-30 DIAGNOSIS — I10 Essential (primary) hypertension: Secondary | ICD-10-CM | POA: Insufficient documentation

## 2015-06-30 DIAGNOSIS — Z87891 Personal history of nicotine dependence: Secondary | ICD-10-CM | POA: Diagnosis not present

## 2015-06-30 HISTORY — DX: Sleep apnea, unspecified: G47.30

## 2015-06-30 HISTORY — DX: Weakness: R53.1

## 2015-06-30 HISTORY — DX: Reserved for inherently not codable concepts without codable children: IMO0001

## 2015-06-30 LAB — CBC WITH DIFFERENTIAL/PLATELET
Basophils Absolute: 0.1 10*3/uL (ref 0.0–0.1)
Basophils Relative: 1 %
EOS ABS: 0.2 10*3/uL (ref 0.0–0.7)
EOS PCT: 2 %
HCT: 44.8 % (ref 39.0–52.0)
HEMOGLOBIN: 15 g/dL (ref 13.0–17.0)
LYMPHS ABS: 1.3 10*3/uL (ref 0.7–4.0)
Lymphocytes Relative: 13 %
MCH: 30.7 pg (ref 26.0–34.0)
MCHC: 33.5 g/dL (ref 30.0–36.0)
MCV: 91.6 fL (ref 78.0–100.0)
MONOS PCT: 6 %
Monocytes Absolute: 0.6 10*3/uL (ref 0.1–1.0)
NEUTROS PCT: 78 %
Neutro Abs: 8.3 10*3/uL — ABNORMAL HIGH (ref 1.7–7.7)
Platelets: 254 10*3/uL (ref 150–400)
RBC: 4.89 MIL/uL (ref 4.22–5.81)
RDW: 12.4 % (ref 11.5–15.5)
WBC: 10.5 10*3/uL (ref 4.0–10.5)

## 2015-06-30 LAB — BASIC METABOLIC PANEL
Anion gap: 6 (ref 5–15)
BUN: 22 mg/dL — AB (ref 6–20)
CHLORIDE: 106 mmol/L (ref 101–111)
CO2: 27 mmol/L (ref 22–32)
CREATININE: 1.12 mg/dL (ref 0.61–1.24)
Calcium: 9.3 mg/dL (ref 8.9–10.3)
GFR calc Af Amer: >60 mL/min (ref >60)
GFR calc non Af Amer: >60 mL/min (ref >60)
GLUCOSE: 107 mg/dL — AB (ref 65–99)
Potassium: 3.8 mmol/L (ref 3.5–5.1)
SODIUM: 139 mmol/L (ref 135–145)

## 2015-06-30 LAB — I-STAT TROPONIN, ED: TROPONIN I, POC: 0 ng/mL (ref 0.00–0.08)

## 2015-06-30 LAB — TROPONIN I: Troponin I: <0.03 ng/mL

## 2015-06-30 LAB — D-DIMER, QUANTITATIVE (NOT AT ARMC): D DIMER QUANT: 2.96 ug{FEU}/mL — AB (ref 0.00–0.50)

## 2015-06-30 MED ORDER — IOPAMIDOL (ISOVUE-370) INJECTION 76%
INTRAVENOUS | Status: AC
  Administered 2015-06-30: 100 mL
  Filled 2015-06-30: qty 100

## 2015-06-30 MED ORDER — VITAMIN C 500 MG PO TABS
3000.0000 mg | ORAL_TABLET | Freq: Every day | ORAL | Status: DC
  Administered 2015-07-01: 3000 mg via ORAL
  Filled 2015-06-30: qty 6

## 2015-06-30 MED ORDER — ASPIRIN 300 MG RE SUPP: 300.0000 mg | RECTAL | Status: DC

## 2015-06-30 MED ORDER — ALPRAZOLAM 0.25 MG PO TABS
0.2500 mg | ORAL_TABLET | Freq: Every day | ORAL | Status: DC | PRN
  Filled 2015-06-30: qty 1

## 2015-06-30 MED ORDER — HEPARIN (PORCINE) IN NACL 100-0.45 UNIT/ML-% IJ SOLN
1250.0000 [IU]/h | INTRAMUSCULAR | Status: DC
  Administered 2015-06-30 (×2): 1250 [IU]/h via INTRAVENOUS
  Filled 2015-06-30: qty 250

## 2015-06-30 MED ORDER — ASPIRIN 81 MG PO CHEW
324.0000 mg | CHEWABLE_TABLET | ORAL | Status: DC
Start: 2015-06-30 — End: 2015-07-01
  Filled 2015-06-30 (×2): qty 4

## 2015-06-30 MED ORDER — HEPARIN BOLUS VIA INFUSION
4000.0000 [IU] | Freq: Once | INTRAVENOUS | Status: AC
  Administered 2015-06-30: 4000 [IU] via INTRAVENOUS
  Filled 2015-06-30: qty 4000

## 2015-06-30 MED ORDER — SODIUM CHLORIDE 0.9 % IV BOLUS (SEPSIS)
1000.0000 mL | Freq: Once | INTRAVENOUS | Status: AC
Start: 2015-06-30 — End: 2015-06-30
  Administered 2015-06-30: 1000 mL via INTRAVENOUS

## 2015-06-30 MED ORDER — ACETAMINOPHEN 325 MG PO TABS: 650.0000 mg | ORAL_TABLET | ORAL | Status: DC | PRN

## 2015-06-30 MED ORDER — LISINOPRIL 10 MG PO TABS
20.0000 mg | ORAL_TABLET | Freq: Every day | ORAL | Status: DC
  Filled 2015-06-30 (×2): qty 2

## 2015-06-30 MED ORDER — ADULT MULTIVITAMIN W/MINERALS CH
1.0000 | ORAL_TABLET | Freq: Every day | ORAL | Status: DC
  Filled 2015-06-30: qty 1

## 2015-06-30 MED ORDER — AMLODIPINE BESYLATE 5 MG PO TABS
5.0000 mg | ORAL_TABLET | Freq: Every day | ORAL | Status: DC
  Administered 2015-07-01: 5 mg via ORAL
  Filled 2015-06-30: qty 1

## 2015-06-30 MED ORDER — CARVEDILOL 3.125 MG PO TABS
3.1250 mg | ORAL_TABLET | Freq: Two times a day (BID) | ORAL | Status: DC
  Filled 2015-06-30: qty 1

## 2015-06-30 MED ORDER — ONDANSETRON HCL 4 MG/2ML IJ SOLN: 4.0000 mg | Freq: Four times a day (QID) | INTRAMUSCULAR | Status: DC | PRN

## 2015-06-30 MED ORDER — NITROGLYCERIN 0.4 MG SL SUBL: 0.4000 mg | SUBLINGUAL_TABLET | SUBLINGUAL | Status: DC | PRN

## 2015-06-30 MED ORDER — ASPIRIN EC 81 MG PO TBEC
81.0000 mg | DELAYED_RELEASE_TABLET | Freq: Every day | ORAL | Status: DC
  Filled 2015-06-30 (×2): qty 1

## 2015-06-30 NOTE — ED Notes (Signed)
Ems states pt is c/o sob after cutting tree limbs today. Pt states he has a panic attack he he gets sob

## 2015-06-30 NOTE — ED Notes (Signed)
Report called. Pt to ct scan

## 2015-06-30 NOTE — ED Provider Notes (Signed)
CSN: [id number]     Arrival date & time 06/30/15  1720 History   First MD Initiated Contact with Patient 06/30/15 1723     Chief Complaint  Patient presents with  . Shortness of Breath     (Consider location/radiation/quality/duration/timing/severity/associated sxs/prior Treatment) HPI Kenneth Dyer is a 70 y.o. male with PMH significant for OSA and HTN who presents with 3 week history of sudden onset, intermittent, moderate shortness of breath x 3 weeks, worsening today while outside doing yard work approximately 3 PM.  Associated symptoms include "feeling jittery all over".  Denies fever, chills, cough, CP, syncope, lightheadedness, N/V/D, abdominal pain, or urinary symptoms.  No family hx of SCD.  Denies tobacco use.  No hx of DVT/PE.  No recent surgery, immobilization, or trauma.  He reports prior to these episodes he was able to walk on the treadmill 30-45 minutes without any dyspnea; however, over these past 3 weeks he has been unable to do so due to increased shortness of breath.  No modifying factors. Has had 324 mg ASA today.   Past Medical History  Diagnosis Date  . Hypertension    Past Surgical History  Procedure Laterality Date  . Knee surgery      right   . Ganglion cyst excision      right wrist  . Tonsillectomy     Family History  Problem Relation Age of Onset  . Hypertension Father   . Skin cancer Father   . Arthritis Mother    Social History  Substance Use Topics  . Smoking status: Former Smoker -- 1.00 packs/day for 15 years    Types: Cigarettes    Quit date: 01/03/1983  . Smokeless tobacco: None  . Alcohol Use: No    Review of Systems All other systems negative unless otherwise stated in HPI    Allergies  Review of patient's allergies indicates no known allergies.  Home Medications   Prior to Admission medications   Medication Sig Start Date End Date Taking? Authorizing Provider  ALPRAZolam Prudy Feeler(XANAX) 0.5 MG tablet Take 0.25 mg by mouth as needed  for anxiety. Reported on 06/30/2015   Yes Historical Provider, MD  amLODipine (NORVASC) 5 MG tablet Take 5 mg by mouth daily.   Yes Historical Provider, MD  Ascorbic Acid (VITAMIN C) 1000 MG tablet Take 3,000 mg by mouth daily.    Yes Historical Provider, MD  aspirin 81 MG tablet Take 81 mg by mouth every other day.   Yes Historical Provider, MD  Cholecalciferol (VITAMIN D3) 1000 units CAPS Take 1,000 Units by mouth daily.    Yes Historical Provider, MD  COD LIVER OIL PO Take 1 capsule by mouth daily.    Yes Historical Provider, MD  Coenzyme Q10 (COQ10) 200 MG CAPS Take 1 capsule by mouth daily.   Yes Historical Provider, MD  lisinopril (PRINIVIL,ZESTRIL) 20 MG tablet Take 20 mg by mouth daily.   Yes Historical Provider, MD  Multiple Vitamin (MULTIVITAMIN) tablet Take 1 tablet by mouth daily. Over 50   Yes Historical Provider, MD  Multiple Vitamins-Minerals (VISION FORMULA/LUTEIN PO) Take 1 tablet by mouth daily.    Yes Historical Provider, MD  Omega-3 Fatty Acids (FISH OIL PO) Take 2,600 mg by mouth daily.    Yes Historical Provider, MD   BP 130/72 mmHg  Pulse 67  Resp 8  Ht 5\' 10"  (1.778 m)  Wt 95.255 kg  BMI 30.13 kg/m2  SpO2 98% Physical Exam  Constitutional: He is oriented to person, place,  and time. He appears well-developed and well-nourished.  Non-toxic appearance. He does not have a sickly appearance. He does not appear ill.  HENT:  Head: Normocephalic and atraumatic.  Mouth/Throat: Oropharynx is clear and moist.  Eyes: Conjunctivae are normal. Pupils are equal, round, and reactive to light.  Neck: Normal range of motion. Neck supple.  Cardiovascular: Normal rate and regular rhythm.   No unilateral lower extremity edema.   Pulmonary/Chest: Effort normal and breath sounds normal. No accessory muscle usage or stridor. No respiratory distress. He has no wheezes. He has no rhonchi. He has no rales.  Abdominal: Soft. Bowel sounds are normal. He exhibits no distension. There is no  tenderness.  Musculoskeletal: Normal range of motion.  Lymphadenopathy:    He has no cervical adenopathy.  Neurological: He is alert and oriented to person, place, and time.  Speech clear without dysarthria.  Skin: Skin is warm and dry.  Psychiatric: He has a normal mood and affect. His behavior is normal.    ED Course  Procedures (including critical care time) Labs Review Labs Reviewed  CBC WITH DIFFERENTIAL/PLATELET - Abnormal; Notable for the following:    Neutro Abs 8.3 (*)    All other components within normal limits  BASIC METABOLIC PANEL - Abnormal; Notable for the following:    Glucose, Bld 107 (*)    BUN 22 (*)    All other components within normal limits  D-DIMER, QUANTITATIVE (NOT AT [facility]) - Abnormal; Notable for the following:    D-Dimer, Quant 2.96 (*)    All other components within normal limits  TROPONIN I  TROPONIN I  TROPONIN I  BASIC METABOLIC PANEL  LIPID PANEL  CBC  PROTIME-INR  I-STAT TROPOININ, ED    Imaging Review Dg Chest 2 View  06/30/2015  CLINICAL DATA:  Shortness of breath for 3 weeks EXAM: CHEST  2 VIEW COMPARISON:  August 16, 2008 FINDINGS: The heart size and mediastinal contours are within normal limits. There is no focal infiltrate, pulmonary edema, or pleural effusion. The visualized skeletal structures are unremarkable. IMPRESSION: No active cardiopulmonary disease. Electronically Signed   By: Sherian Rein M.D.   On: 06/30/2015 18:16   I have personally reviewed and evaluated these images and lab results as part of my medical decision-making.   EKG Interpretation   Date/Time:  Wednesday June 30 2015 18:27:56 EDT Ventricular Rate:  68 PR Interval:    QRS Duration: 105 QT Interval:  405 QTC Calculation: 431 R Axis:   71 Text Interpretation:  Sinus rhythm Confirmed by Fayrene Fearing  MD, MARK ([id number]) on  06/30/2015 6:33:23 PM      MDM   Final diagnoses:  Acute coronary syndrome (HCC)  Shortness of breath   Patient presents with 3 week  history of worsening shortness of breath and DOE.  Denies CP; however, prior to this patient was able to exercise on the treadmill 30-45 minutes without dyspnea and now gets extremely short of breath with simple exertion.  Risk factors include age and HTN.  VSS, NAD.  Heart RRR, lungs CTAB, abdomen soft and benign.  EKG without acute changes, CXR normal, Troponin x 1 normal.  Hgb stable.  Low risk PE using Well's criteria, d-dimer 2.96.  HEART score 5.  No metabolic derangements.  Will consult cardiology with concern for ACS. Patient will be seen by Dr. Algie Coffer.  Will obtain chest CTA to evaluate for PE.  Patient will be admitted by cardiology, Dr. Algie Coffer for further evaluation.  Case has been  discussed with and seen by Dr. Fayrene FearingJames who agrees with the above plan.    Cheri FowlerKayla Kayston Jodoin, PA-C 06/30/15 2036  Rolland PorterMark Thinh, MD 07/15/15 [phone]

## 2015-06-30 NOTE — ED Notes (Signed)
Pt waiting on ct angio

## 2015-06-30 NOTE — Progress Notes (Signed)
ANTICOAGULATION CONSULT NOTE - Initial Consult  Pharmacy Consult for Heparin Indication: chest pain/ACS  No Known Allergies  Patient Measurements: Height: 5\' 10"  (177.8 cm) Weight: 210 lb (95.255 kg) IBW/kg (Calculated) : 73 Heparin Dosing Weight: 92 kg  Vital Signs: BP: 130/72 mmHg (06/28 1921) Pulse Rate: 67 (06/28 1922)  Labs:  Recent Labs  06/30/15 1827  HGB 15.0  HCT 44.8  PLT 254  CREATININE 1.12    Estimated Creatinine Clearance: 71.1 mL/min (by C-G formula based on Cr of 1.12).   Medical History: Past Medical History  Diagnosis Date  . Hypertension     Medications:   (Not in a hospital admission) Scheduled:  . [START ON 07/01/2015] amLODipine  5 mg Oral Daily  . aspirin  324 mg Oral NOW   Or  . aspirin  300 mg Rectal NOW  . [START ON 07/01/2015] aspirin EC  81 mg Oral Daily  . [START ON 07/01/2015] carvedilol  3.125 mg Oral BID WC  . [START ON 07/01/2015] lisinopril  20 mg Oral Daily  . [START ON 07/01/2015] multivitamin with minerals  1 tablet Oral Daily  . vitamin C  3,000 mg Oral Daily   Infusions:    Assessment: 70yo male with history of HTN presents with CP and SOB. Pharmacy is consulted to dose heparin for ACS/chest pain.   Goal of Therapy:  Heparin level 0.3-0.7 units/ml Monitor platelets by anticoagulation protocol: Yes   Plan:  Give 4000 units bolus x 1 Start heparin infusion at 1250 units/hr Check anti-Xa level in 8 hours and daily while on heparin Continue to monitor H&H and platelets  Arlean Hoppingorey M. Newman PiesBall, PharmD, BCPS Clinical Pharmacist Pager [phone] 06/30/2015,8:36 PM

## 2015-06-30 NOTE — H&P (Signed)
Referring Physician:  [name] is an 70 y.o. male.                       Chief Complaint: Chest pain and shortness of breath.  HPI: 70 year old male with hypertension, hyperlipidemia, obstructive sleep apnea, remote history of smoking has shortness of breath with activity that started 3 weeks ago and has worsened more so today after light duty work in yard. He has some left sided chest discomfort/tightness without radiation also.   Past Medical History  Diagnosis Date  . Hypertension       Past Surgical History  Procedure Laterality Date  . Knee surgery      right   . Ganglion cyst excision      right wrist  . Tonsillectomy      Family History  Problem Relation Age of Onset  . Hypertension Father   . Skin cancer Father   . Arthritis Mother    Social History:  reports that he quit smoking about 32 years ago. His smoking use included Cigarettes. He has a 15 pack-year smoking history. He does not have any smokeless tobacco history on file. He reports that he does not drink alcohol or use illicit drugs.  Allergies: No Known Allergies   (Not in a hospital admission)  Results for orders placed or performed during the hospital encounter of 06/30/15 (from the past 48 hour(s))  CBC with Differential     Status: Abnormal   Collection Time: 06/30/15  6:27 PM  Result Value Ref Range   WBC 10.5 4.0 - 10.5 K/uL   RBC 4.89 4.22 - 5.81 MIL/uL   Hemoglobin 15.0 13.0 - 17.0 g/dL   HCT 44.8 39.0 - 52.0 %   MCV 91.6 78.0 - 100.0 fL   MCH 30.7 26.0 - 34.0 pg   MCHC 33.5 30.0 - 36.0 g/dL   RDW 12.4 11.5 - 15.5 %   Platelets 254 150 - 400 K/uL   Neutrophils Relative % 78 %   Neutro Abs 8.3 (H) 1.7 - 7.7 K/uL   Lymphocytes Relative 13 %   Lymphs Abs 1.3 0.7 - 4.0 K/uL   Monocytes Relative 6 %   Monocytes Absolute 0.6 0.1 - 1.0 K/uL   Eosinophils Relative 2 %   Eosinophils Absolute 0.2 0.0 - 0.7 K/uL   Basophils Relative 1 %   Basophils Absolute 0.1 0.0 - 0.1 K/uL  Basic  metabolic panel     Status: Abnormal   Collection Time: 06/30/15  6:27 PM  Result Value Ref Range   Sodium 139 135 - 145 mmol/L   Potassium 3.8 3.5 - 5.1 mmol/L   Chloride 106 101 - 111 mmol/L   CO2 27 22 - 32 mmol/L   Glucose, Bld 107 (H) 65 - 99 mg/dL   BUN 22 (H) 6 - 20 mg/dL   Creatinine, Ser 1.12 0.61 - 1.24 mg/dL   Calcium 9.3 8.9 - 10.3 mg/dL   GFR calc non Af Amer >60 >60 mL/min   GFR calc Af Amer >60 >60 mL/min    Comment: (NOTE) The eGFR has been calculated using the CKD EPI equation. This calculation has not been validated in all clinical situations. eGFR's persistently <60 mL/min signify possible Chronic Kidney Disease.    Anion gap 6 5 - 15  D-dimer, quantitative (not at [facility])     Status: Abnormal   Collection Time: 06/30/15  6:27 PM  Result Value Ref Range   D-Dimer,  Quant 2.96 (H) 0.00 - 0.50 ug/mL-FEU    Comment: (NOTE) At the manufacturer cut-off of 0.50 ug/mL FEU, this assay has been documented to exclude PE with a sensitivity and negative predictive value of 97 to 99%.  At this time, this assay has not been approved by the FDA to exclude DVT/VTE. Results should be correlated with clinical presentation.   I-Stat Troponin, ED (not at [facility])     Status: None   Collection Time: 06/30/15  6:37 PM  Result Value Ref Range   Troponin i, poc 0.00 0.00 - 0.08 ng/mL   Comment 3            Comment: Due to the release kinetics of cTnI, a negative result within the first hours of the onset of symptoms does not rule out myocardial infarction with certainty. If myocardial infarction is still suspected, repeat the test at appropriate intervals.    Dg Chest 2 View  06/30/2015  CLINICAL DATA:  Shortness of breath for 3 weeks EXAM: CHEST  2 VIEW COMPARISON:  August 16, 2008 FINDINGS: The heart size and mediastinal contours are within normal limits. There is no focal infiltrate, pulmonary edema, or pleural effusion. The visualized skeletal structures are unremarkable.  IMPRESSION: No active cardiopulmonary disease. Electronically Signed   By: Abelardo Diesel M.D.   On: 06/30/2015 18:16    Review Of Systems Constitutional: Negative for fever and unexpected weight change.  HENT: Negative for congestion, dental problem, ear pain, nosebleeds, postnasal drip, rhinorrhea, sinus pressure, sneezing, sore throat and trouble swallowing.  Eyes: Negative for redness and itching.  Respiratory: Positive for shortness of breath. Negative for cough, chest tightness and wheezing.  Cardiovascular: Negative for palpitations and leg swelling.  Gastrointestinal: Negative for nausea and vomiting.  Genitourinary: Negative for dysuria.  Musculoskeletal: Negative for joint swelling.  Skin: Negative for rash.  Neurological: Negative for headaches.  Hematological: Does not bruise/bleed easily.  Psychiatric/Behavioral: Negative for dysphoric mood. The patient is not nervous/anxious.   Blood pressure 130/72, pulse 67, resp. rate 8, height 5' 10"  (1.778 m), weight 95.255 kg (210 lb), SpO2 98 %. Gen. Pleasant, well built, well-nourished, in no distress, normal affect ENT - /AT, Conj-pink, Sclera-white, no lesions, no post nasal drip Neck: No JVD, no thyromegaly, no carotid bruits Lungs: No use of accessory muscles, no dullness to percussion, clear without rales or rhonchi.  Cardiovascular: Rhythm regular, heart sounds normal, no murmurs or gallops, no peripheral edema Abdomen: soft and non-tender, no hepatosplenomegaly, BS normal. Musculoskeletal: No deformities, no cyanosis or clubbing Neuro: alert, non focal Skin: Warm and dry. Psych: Normal mood and affect.  Assessment/Plan Chest pain Shortness of breath R/O MI Hypertension Obesity Hyperlipidemia Obstructive sleep apnea  Nuclear stress test v/s cardiac cath in AM.  Birdie Riddle, MD  06/30/2015, 8:27 PM

## 2015-06-30 NOTE — ED Provider Notes (Signed)
Patient discussed with Cheri FowlerKayla Rose PA-C. Patient seen and examined. He reports that he previously was able to 45 minutes on the treadmill without dyspnea. This was recently as 3 weeks ago. He had a day where he had a work of his father's house doing some fairly heavy work in the yard and was short of breath during that time. He describes it as "an episode". He states since that time he is easily short of breath. Today he simply walked down some stairs and was doing some light work with apparent head clippers in his yard. He was short of breath. He had walked back up 1 flight of stairs to his apartment felt like he was short of breath and wasn't [place] was [place]. He doesn't have chest pain. He describes some significant "jitteriness". States at times "it's like I had too much caffeine and I can't slow down my heart or my breathing". No pain. No syncope.  His EKG does not show any ischemia. His x-ray shows no crit megaly or fluid or infiltrate. Await first troponin and hemoglobin.  This is a 70 year old with no dyspnea on exertion that is pronounced with in the last 3 weeks. Will ask cardiology referral regarding new-onset DOE.  Rolland PorterMark Jonus, MD 06/30/15 1840

## 2015-07-01 ENCOUNTER — Encounter (HOSPITAL_COMMUNITY)
Admission: EM | Disposition: A | Discharge status: Home / Self Care | Payer: Self-pay | Source: Home / Self Care | Attending: Emergency Medicine

## 2015-07-01 ENCOUNTER — Observation Stay (HOSPITAL_COMMUNITY): Payer: Medicare HMO

## 2015-07-01 ENCOUNTER — Encounter (HOSPITAL_COMMUNITY): Payer: Self-pay | Admitting: General Practice

## 2015-07-01 DIAGNOSIS — R072 Precordial pain: Secondary | ICD-10-CM | POA: Diagnosis not present

## 2015-07-01 DIAGNOSIS — R0602 Shortness of breath: Secondary | ICD-10-CM | POA: Diagnosis not present

## 2015-07-01 DIAGNOSIS — I10 Essential (primary) hypertension: Secondary | ICD-10-CM | POA: Diagnosis not present

## 2015-07-01 DIAGNOSIS — E669 Obesity, unspecified: Secondary | ICD-10-CM | POA: Diagnosis not present

## 2015-07-01 DIAGNOSIS — E785 Hyperlipidemia, unspecified: Secondary | ICD-10-CM | POA: Diagnosis not present

## 2015-07-01 DIAGNOSIS — E6609 Other obesity due to excess calories: Secondary | ICD-10-CM | POA: Diagnosis not present

## 2015-07-01 DIAGNOSIS — I2510 Atherosclerotic heart disease of native coronary artery without angina pectoris: Secondary | ICD-10-CM | POA: Diagnosis not present

## 2015-07-01 HISTORY — PX: CARDIAC CATHETERIZATION: SHX172

## 2015-07-01 LAB — LIPID PANEL
Cholesterol: 172 mg/dL (ref 0–200)
HDL: 37 mg/dL — ABNORMAL LOW (ref >40)
LDL Cholesterol: 120 mg/dL — ABNORMAL HIGH (ref 0–99)
Total CHOL/HDL Ratio: 4.6 RATIO
Triglycerides: 73 mg/dL (ref <150)
VLDL: 15 mg/dL (ref 0–40)

## 2015-07-01 LAB — CBC
HEMATOCRIT: 40.6 % (ref 39.0–52.0)
HEMOGLOBIN: 13.5 g/dL (ref 13.0–17.0)
MCH: 30.8 pg (ref 26.0–34.0)
MCHC: 33.3 g/dL (ref 30.0–36.0)
MCV: 92.5 fL (ref 78.0–100.0)
Platelets: 235 10*3/uL (ref 150–400)
RBC: 4.39 MIL/uL (ref 4.22–5.81)
RDW: 12.4 % (ref 11.5–15.5)
WBC: 8.2 10*3/uL (ref 4.0–10.5)

## 2015-07-01 LAB — BASIC METABOLIC PANEL
Anion gap: 4 — ABNORMAL LOW (ref 5–15)
BUN: 17 mg/dL (ref 6–20)
CALCIUM: 8.6 mg/dL — AB (ref 8.9–10.3)
CO2: 28 mmol/L (ref 22–32)
Chloride: 108 mmol/L (ref 101–111)
Creatinine, Ser: 1.09 mg/dL (ref 0.61–1.24)
GFR calc Af Amer: >60 mL/min (ref >60)
GLUCOSE: 104 mg/dL — AB (ref 65–99)
Potassium: 3.8 mmol/L (ref 3.5–5.1)
Sodium: 140 mmol/L (ref 135–145)

## 2015-07-01 LAB — ECHOCARDIOGRAM COMPLETE
AVLVOTPG: 8 mmHg
Ao-asc: 36 cm
CHL CUP DOP CALC LVOT VTI: 27.4 cm
CHL CUP MV DEC (S): 204
E/e' ratio: 7.8
EWDT: 204 ms
FS: 45 % — AB (ref 28–44)
HEIGHTINCHES: 70 in
IV/PV OW: 1.02
LA vol A4C: 62 ml
LA vol: 66.5 mL
LADIAMINDEX: 1.7 cm/m2
LASIZE: 36 mm
LAVOLIN: 31.4 mL/m2
LEFT ATRIUM END SYS DIAM: 36 mm
LV E/e' medial: 7.8
LV TDI E'LATERAL: 12.3
LV e' LATERAL: 12.3 cm/s
LVEEAVG: 7.8
LVOT area: 3.46 cm2
LVOTD: 21 mm
LVOTPV: 142 cm/s
LVOTSV: 95 mL
MV Peak grad: 4 mmHg
MV pk A vel: 92.2 m/s
MVPKEVEL: 96 m/s
PW: 9.25 mm — AB (ref 0.6–1.1)
RV TAPSE: 25.4 mm
TDI e' medial: 7.29
Weight: 3304 oz

## 2015-07-01 LAB — TROPONIN I
TROPONIN I: 0.04 ng/mL — AB (ref <0.03)
TROPONIN I: 0.03 ng/mL — AB (ref <0.03)

## 2015-07-01 LAB — PROTIME-INR
INR: 1.3 (ref 0.00–1.49)
PROTHROMBIN TIME: 16.3 s — AB (ref 11.6–15.2)

## 2015-07-01 LAB — HEPARIN LEVEL (UNFRACTIONATED)
HEPARIN UNFRACTIONATED: 0.45 [IU]/mL (ref 0.30–0.70)
Heparin Unfractionated: <0.1 IU/mL — ABNORMAL LOW (ref 0.30–0.70)

## 2015-07-01 LAB — POCT ACTIVATED CLOTTING TIME: Activated Clotting Time: 103 seconds

## 2015-07-01 SURGERY — LEFT HEART CATH AND CORONARY ANGIOGRAPHY
Anesthesia: LOCAL

## 2015-07-01 MED ORDER — SODIUM CHLORIDE 0.9% FLUSH: 3.0000 mL | Freq: Two times a day (BID) | INTRAVENOUS | Status: DC

## 2015-07-01 MED ORDER — LIDOCAINE HCL (PF) 1 % IJ SOLN
INTRAMUSCULAR | Status: AC
  Filled 2015-07-01: qty 30

## 2015-07-01 MED ORDER — LIDOCAINE HCL (PF) 1 % IJ SOLN
INTRAMUSCULAR | Status: DC | PRN
  Administered 2015-07-01: 18 mL via SUBCUTANEOUS

## 2015-07-01 MED ORDER — HEPARIN (PORCINE) IN NACL 2-0.9 UNIT/ML-% IJ SOLN
INTRAMUSCULAR | Status: DC | PRN
  Administered 2015-07-01: 1000 mL

## 2015-07-01 MED ORDER — FENTANYL CITRATE (PF) 100 MCG/2ML IJ SOLN
INTRAMUSCULAR | Status: AC
  Filled 2015-07-01: qty 2

## 2015-07-01 MED ORDER — PANTOPRAZOLE SODIUM 40 MG PO TBEC: 40.0000 mg | DELAYED_RELEASE_TABLET | Freq: Every day | ORAL | Status: AC

## 2015-07-01 MED ORDER — IOPAMIDOL (ISOVUE-370) INJECTION 76%
INTRAVENOUS | Status: AC
  Filled 2015-07-01: qty 100

## 2015-07-01 MED ORDER — MIDAZOLAM HCL 2 MG/2ML IJ SOLN
INTRAMUSCULAR | Status: AC
  Filled 2015-07-01: qty 2

## 2015-07-01 MED ORDER — FENTANYL CITRATE (PF) 100 MCG/2ML IJ SOLN
INTRAMUSCULAR | Status: DC | PRN
  Administered 2015-07-01 (×2): 25 ug via INTRAVENOUS

## 2015-07-01 MED ORDER — SODIUM CHLORIDE 0.9% FLUSH: 3.0000 mL | INTRAVENOUS | Status: DC | PRN

## 2015-07-01 MED ORDER — IPRATROPIUM-ALBUTEROL 20-100 MCG/ACT IN AERS: 1.0000 | INHALATION_SPRAY | Freq: Four times a day (QID) | RESPIRATORY_TRACT | Status: DC | PRN

## 2015-07-01 MED ORDER — ASPIRIN 81 MG PO CHEW
81.0000 mg | CHEWABLE_TABLET | ORAL | Status: AC
  Administered 2015-07-01: 81 mg via ORAL

## 2015-07-01 MED ORDER — SODIUM CHLORIDE 0.9 % IV SOLN: 250.0000 mL | INTRAVENOUS | Status: DC | PRN

## 2015-07-01 MED ORDER — MIDAZOLAM HCL 2 MG/2ML IJ SOLN
INTRAMUSCULAR | Status: DC | PRN
  Administered 2015-07-01 (×2): 1 mg via INTRAVENOUS

## 2015-07-01 MED ORDER — VITAMIN C 1000 MG PO TABS: 1000.0000 mg | ORAL_TABLET | Freq: Every day | ORAL | Status: AC

## 2015-07-01 MED ORDER — SODIUM CHLORIDE 0.9 % WEIGHT BASED INFUSION: 1.0000 mL/kg/h | INTRAVENOUS | Status: DC

## 2015-07-01 MED ORDER — IOPAMIDOL (ISOVUE-370) INJECTION 76%
INTRAVENOUS | Status: DC | PRN
  Administered 2015-07-01: 75 mL via INTRA_ARTERIAL

## 2015-07-01 MED ORDER — SODIUM CHLORIDE 0.9 % WEIGHT BASED INFUSION
3.0000 mL/kg/h | INTRAVENOUS | Status: AC
  Administered 2015-07-01: 3 mL/kg/h via INTRAVENOUS

## 2015-07-01 MED ORDER — IPRATROPIUM-ALBUTEROL 0.5-2.5 (3) MG/3ML IN SOLN: 3.0000 mL | Freq: Four times a day (QID) | RESPIRATORY_TRACT | Status: DC | PRN

## 2015-07-01 MED ORDER — HEPARIN (PORCINE) IN NACL 2-0.9 UNIT/ML-% IJ SOLN
INTRAMUSCULAR | Status: AC
  Filled 2015-07-01: qty 1000

## 2015-07-01 MED ORDER — SODIUM CHLORIDE 0.9 % WEIGHT BASED INFUSION: 1.0000 mL/kg/h | INTRAVENOUS | Status: AC

## 2015-07-01 SURGICAL SUPPLY — 8 items
CATH INFINITI 5FR MULTPACK ANG (CATHETERS) ×2 IMPLANT
KIT HEART LEFT (KITS) ×2 IMPLANT
PACK CARDIAC CATHETERIZATION (CUSTOM PROCEDURE TRAY) ×2 IMPLANT
SHEATH PINNACLE 5F 10CM (SHEATH) ×2 IMPLANT
SYR MEDRAD MARK V 150ML (SYRINGE) ×2 IMPLANT
TRANSDUCER W/STOPCOCK (MISCELLANEOUS) ×2 IMPLANT
WIRE EMERALD 3MM-J .035X150CM (WIRE) ×2 IMPLANT
WIRE EMERALD ST .035X150CM (WIRE) IMPLANT

## 2015-07-01 NOTE — Care Management Obs Status (Signed)
MEDICARE OBSERVATION STATUS NOTIFICATION   Patient Details  Name: Kenneth Dyer MRN: [id number] Date of Birth: 10/22/1945   Medicare Observation Status Notification Given:  Yes    Darrold SpanWebster, Bunny Lowdermilk Hall, RN 07/01/2015, 12:31 PM

## 2015-07-01 NOTE — Progress Notes (Signed)
Echocardiogram 2D Echocardiogram has been performed.  Kenneth Dyer 07/01/2015, 10:32 AM

## 2015-07-01 NOTE — Progress Notes (Signed)
Site area: Medical illustratort fem art Site Prior to Removal:  Level 0 Pressure Applied For: 25min Manual:   yes Patient Status During Pull:  A/O Post Pull Site:  Level 0 Post Pull Instructions Given:  Instructions given and pt understands Post Pull Pulses Present: 2+ rt dp/pt Dressing Applied:  tegaderm and a 4x4 Bedrest begins @ 09:00:00 Comments: Pt leaves Cath Lab  HA in stable condition. Rt groin unremarkable.

## 2015-07-01 NOTE — Interval H&P Note (Signed)
History and Physical Interval Note:  07/01/2015 7:41 AM  Kenneth Dyer  has presented today for surgery, with the diagnosis of SOB  The various methods of treatment have been discussed with the patient and family. After consideration of risks, benefits and other options for treatment, the patient has consented to  Procedure(s): Left Heart Cath and Coronary Angiography (N/A) as a surgical intervention .  The patient's history has been reviewed, patient examined, no change in status, stable for surgery.  I have reviewed the patient's chart and labs.  Questions were answered to the patient's satisfaction.     Daryana Whirley S

## 2015-07-01 NOTE — Progress Notes (Signed)
ANTICOAGULATION CONSULT NOTE - Follow-up Consult  Pharmacy Consult for Heparin Indication: chest pain/ACS  No Known Allergies  Patient Measurements: Height: 5\' 10"  (177.8 cm) Weight: 206 lb 8 oz (93.668 kg) IBW/kg (Calculated) : 73 Heparin Dosing Weight: 92 kg  Vital Signs: Temp: 98.4 F (36.9 C) (06/29 0311) Temp Source: Oral (06/29 0311) BP: 122/65 mmHg (06/29 0311) Pulse Rate: 58 (06/29 0311)  Labs:  Recent Labs  06/30/15 1827 06/30/15 2118 07/01/15 0215 07/01/15 0216 07/01/15 0455  HGB 15.0  --  13.5  --   --   HCT 44.8  --  40.6  --   --   PLT 254  --  235  --   --   LABPROT  --   --   --  16.3*  --   INR  --   --   --  1.30  --   HEPARINUNFRC  --   --   --   --  0.45  CREATININE 1.12  --   --  1.09  --   TROPONINI  --  <0.03  --  0.04*  --     Estimated Creatinine Clearance: 72.5 mL/min (by C-G formula based on Cr of 1.09).   Assessment: 70yo male on heparin for ACS/chest pain. Heparin level therapeutic on 1250 units/hr. No bleeding noted.   Goal of Therapy:  Heparin level 0.3-0.7 units/ml Monitor platelets by anticoagulation protocol: Yes   Plan:  Continue heparin at 1250 units/hr F/u 6 hr confirmatory heparin level  Christoper Fabianaron Mariea Mcmartin, PharmD, BCPS Clinical pharmacist, pager [phone] 07/01/2015,5:43 AM

## 2015-07-01 NOTE — Discharge Summary (Signed)
Physician Discharge Summary  Patient ID: Kenneth Dyer MRN: [id number] DOB/AGE: 71/09/1945 71 y.o.  Admit date: 06/30/2015 Discharge date: 07/01/2015  Admission Diagnoses: Chest pain Shortness of breath R/O MI Hypertension Obesity Hyperlipidemia Obstructive sleep apnea  Discharge Diagnoses:  Principle problem: * Chest pain * Active Problems:   Minimal multivessel native vessel coronary artery disease   Shortness of breath   Early obstructive lung disease   Hypertension   Obesity   Hyperlipidemia   Obstructive sleep apnea   Chronic left lung granulomas   GERD  Discharged Condition: good  Hospital Course: 70 year old male with hypertension, hyperlipidemia, obstructive sleep apnea, remote history of smoking has shortness of breath with activity that started 3 weeks ago and has worsened more so today after light duty work in yard. He has some left sided chest discomfort/tightness without radiation also.  He underwent cardiac catheterization that showed minimal coronary artery disease with normal LV systolic function. His CT scan of chest was negative for Pulmonary Embolism but positive for several left lung calcified granulomas. Patient remembered working in [employer] several years ago. Combivent respimat will be started and he may undergo PFT and pulmonary consult on OP basis. Pantoprazole will be started for GERD. He will be followed by me in 1 week and by primary care doctor Kenneth Dyer in 2 weeks.   Consults: cardiology  Significant Diagnostic Studies: labs: Normal CBC, BMET, borderline Troponin-I of 0.04 and blood sugar of 104 mg.   EKG-NSR.  Chest X-ray-Normal.  CT of chest: Negative for PE. Positive for left lung calcified granulomas.  Cardiac cath: Minimal coronary artery disease with normal LV systolic function.  Echocardiogram: Normal LV systolic function with minimal MR and mild TR.  Treatments: cardiac meds: lisinopril (generic) and amlodipine. Pantoprazole for  possible GERD and Combivent respimat for early COPD/Asthma  Discharge Exam: Blood pressure 135/80, pulse 56, temperature 98.4 F (36.9 C), temperature source Oral, resp. rate 18, height 5\' 10"  (1.778 m), weight 93.668 kg (206 lb 8 oz), SpO2 100 %. Gen. Pleasant, well built, well-nourished, in no distress, normal affect ENT - [name], Conj-pink, Sclera-white, no lesions, no post nasal drip Neck: No JVD, no thyromegaly, no carotid bruits Lungs: No use of accessory muscles, no dullness to percussion, Forced expiratory wheezing, left more than right.  Cardiovascular: Rhythm regular, heart sounds normal, no murmurs or gallops, no peripheral edema Abdomen: soft and non-tender, no hepatosplenomegaly, BS normal. Musculoskeletal: No deformities, no cyanosis or clubbing. No right groin hematoma. Neuro: alert, non focal Skin: Warm and dry. Psych: Normal mood and affect.  Disposition: 01 Home or self care.     Medication List    STOP taking these medications        COD LIVER OIL PO      TAKE these medications        ALPRAZolam 0.5 MG tablet  Commonly known as:  XANAX  Take 0.25 mg by mouth as needed for anxiety. Reported on 06/30/2015     amLODipine 5 MG tablet  Commonly known as:  NORVASC  Take 5 mg by mouth daily.     aspirin 81 MG tablet  Take 81 mg by mouth every other day.     CoQ10 200 MG Caps  Take 1 capsule by mouth daily.     FISH OIL PO  Take 2,600 mg by mouth daily.     Ipratropium-Albuterol 20-100 MCG/ACT Aers respimat  Commonly known as:  COMBIVENT  Inhale 1 puff into the lungs every 6 (six)  hours as needed for wheezing or shortness of breath.     lisinopril 20 MG tablet  Commonly known as:  PRINIVIL,ZESTRIL  Take 20 mg by mouth daily.     multivitamin tablet  Take 1 tablet by mouth daily. Over 50     pantoprazole 40 MG tablet  Commonly known as:  PROTONIX  Take 1 tablet (40 mg total) by mouth daily.     VISION FORMULA/LUTEIN PO  Take 1 tablet by mouth  daily.     vitamin C 1000 MG tablet  Take 1 tablet (1,000 mg total) by mouth daily.     Vitamin D3 1000 units Caps  Take 1,000 Units by mouth daily.           Follow-up Information    Follow up with Kenneth Dyer, Kenneth Dyer, Kenneth Dyer. Schedule an appointment as soon as possible for a visit in 2 weeks.   Specialty:  Family Medicine   Contact information:   [address] [place] [address] [phone]       [facility] Kenneth Dyer Dyer, Kenneth Dyer. Schedule an appointment as soon as possible for a visit in 1 week.   Specialty:  Cardiology   Contact information:   [address] [place] [address] [phone]       Signed: Ricki RodriguezKADAKIA,Kenneth Dyer 07/01/2015, 6:18 PM

## 2015-07-01 NOTE — Progress Notes (Signed)
Pt troponin 0.04, pt asymptomatic, VSS.  MD Algie CofferKadakia notified.  RN will continue to monitor.  Erenest Rasheraldwell,Raveena Hebdon B, RN

## 2015-07-05 DIAGNOSIS — Z09 Encounter for follow-up examination after completed treatment for conditions other than malignant neoplasm: Secondary | ICD-10-CM | POA: Diagnosis not present

## 2015-07-05 DIAGNOSIS — R0602 Shortness of breath: Secondary | ICD-10-CM | POA: Diagnosis not present

## 2015-07-05 DIAGNOSIS — I10 Essential (primary) hypertension: Secondary | ICD-10-CM | POA: Diagnosis not present

## 2015-07-09 DIAGNOSIS — G4733 Obstructive sleep apnea (adult) (pediatric): Secondary | ICD-10-CM | POA: Diagnosis not present

## 2015-07-20 DIAGNOSIS — R072 Precordial pain: Secondary | ICD-10-CM | POA: Diagnosis not present

## 2015-07-20 DIAGNOSIS — R0602 Shortness of breath: Secondary | ICD-10-CM | POA: Diagnosis not present

## 2015-07-20 DIAGNOSIS — I2510 Atherosclerotic heart disease of native coronary artery without angina pectoris: Secondary | ICD-10-CM | POA: Diagnosis not present

## 2015-07-20 DIAGNOSIS — I10 Essential (primary) hypertension: Secondary | ICD-10-CM | POA: Diagnosis not present

## 2015-07-20 DIAGNOSIS — E6609 Other obesity due to excess calories: Secondary | ICD-10-CM | POA: Diagnosis not present

## 2015-08-09 ENCOUNTER — Institutional Professional Consult (permissible substitution): Payer: Medicare HMO | Admitting: Internal Medicine

## 2015-08-18 DIAGNOSIS — R748 Abnormal levels of other serum enzymes: Secondary | ICD-10-CM | POA: Diagnosis not present

## 2015-08-18 DIAGNOSIS — M6281 Muscle weakness (generalized): Secondary | ICD-10-CM | POA: Diagnosis not present

## 2015-09-02 ENCOUNTER — Other Ambulatory Visit: Payer: Self-pay | Admitting: *Deleted

## 2015-09-02 DIAGNOSIS — R748 Abnormal levels of other serum enzymes: Secondary | ICD-10-CM

## 2015-09-02 DIAGNOSIS — M6281 Muscle weakness (generalized): Secondary | ICD-10-CM

## 2015-09-08 ENCOUNTER — Encounter: Payer: Self-pay | Admitting: Pulmonary Disease

## 2015-09-08 ENCOUNTER — Encounter (INDEPENDENT_AMBULATORY_CARE_PROVIDER_SITE_OTHER): Payer: Self-pay

## 2015-09-08 ENCOUNTER — Ambulatory Visit (INDEPENDENT_AMBULATORY_CARE_PROVIDER_SITE_OTHER): Payer: Medicare HMO | Admitting: Pulmonary Disease

## 2015-09-08 DIAGNOSIS — R142 Eructation: Secondary | ICD-10-CM | POA: Insufficient documentation

## 2015-09-08 DIAGNOSIS — R0600 Dyspnea, unspecified: Secondary | ICD-10-CM

## 2015-09-08 DIAGNOSIS — R748 Abnormal levels of other serum enzymes: Secondary | ICD-10-CM | POA: Diagnosis not present

## 2015-09-08 DIAGNOSIS — I10 Essential (primary) hypertension: Secondary | ICD-10-CM | POA: Insufficient documentation

## 2015-09-08 DIAGNOSIS — F411 Generalized anxiety disorder: Secondary | ICD-10-CM | POA: Insufficient documentation

## 2015-09-08 NOTE — Addendum Note (Signed)
Addended by: Tommie SamsSILVA, MINDY S on: 09/08/2015 04:04 PM   Modules accepted: Orders

## 2015-09-08 NOTE — Progress Notes (Signed)
Subjective:    Patient ID: Kenneth Dyer, male    DOB: April 16, 1945, 70 y.o.   MRN: [id number]  HPI Patient followed by Dr. Vassie Loll for severe OSA. Prescribed CPAP therapy at 12 cm H2O pressure. He reports he has noticed dyspnea for "months". He reports he did have frequent sinus infections and "colds" as a child. He does feel that when he develops sinus congestion he also notices a "tightness" in his chest. He reports he was "almost diagnosed with asthma as a child". He denies any history of recurrent bronchitis as an adult. He reports his symptoms always start with sinus congestion & drainage. He reports his dyspnea is intermittent. He reports he is able to walk 5 days a week and also walk on his treadmill without any dyspnea. He reports he does have dyspnea at rest that he also describes as a "tightness", sometimes with simply watching TV. He does report a very mild wheeze. Denies any coughing recently. He reports he does have frequent eructation. He denies any other dyspepsia or reflux symptoms. No morning brash water taste. Denies any dysphagia or odynophagia. He does report some very mild "instability" with turning quickly. No syncope or near syncope. Denies any palpitations. No rashes or abnormal bruising. No joint pain, swelling, or stiffness. Does report some mild swelling in his feet. He hasn't been taking Protonix but couldn't tell a difference in his eructation. He was prescribed a Combivent inhaler but didn't use it. He hasn't used any inhalers in the past either. His CK has been elevated for years and he feels he has lost significant strength. Follows with Rheumatology with Dr. Mallie Mussel.   Review of Systems No dysuria or hematuria. No fever, chills, or sweats.  A pertinent 14 point review of systems is negative except as per the history of presenting illness.  No Known Allergies  Current Outpatient Prescriptions on File Prior to Visit  Medication Sig Dispense Refill  . ALPRAZolam (XANAX) 0.5  MG tablet Take 0.25 mg by mouth as needed for anxiety. Reported on 06/30/2015    . amLODipine (NORVASC) 5 MG tablet Take 5 mg by mouth daily.    . Ascorbic Acid (VITAMIN C) 1000 MG tablet Take 1 tablet (1,000 mg total) by mouth daily.    Marland Kitchen aspirin 81 MG tablet Take 81 mg by mouth every other day.    . Cholecalciferol (VITAMIN D3) 1000 units CAPS Take 1,000 Units by mouth daily.     . Coenzyme Q10 (COQ10) 200 MG CAPS Take 1 capsule by mouth daily.    Marland Kitchen lisinopril (PRINIVIL,ZESTRIL) 20 MG tablet Take 20 mg by mouth daily.    . Multiple Vitamin (MULTIVITAMIN) tablet Take 1 tablet by mouth daily. Over 50    . Multiple Vitamins-Minerals (VISION FORMULA/LUTEIN PO) Take 1 tablet by mouth daily.     . Omega-3 Fatty Acids (FISH OIL PO) Take 2,600 mg by mouth daily.     . Ipratropium-Albuterol (COMBIVENT) 20-100 MCG/ACT AERS respimat Inhale 1 puff into the lungs every 6 (six) hours as needed for wheezing or shortness of breath. (Patient not taking: Reported on 09/08/2015) 1 Inhaler 3  . pantoprazole (PROTONIX) 40 MG tablet Take 1 tablet (40 mg total) by mouth daily. (Patient not taking: Reported on 09/08/2015) 30 tablet 3   No current facility-administered medications on file prior to visit.     Past Medical History:  Diagnosis Date  . Hypertension   . Shortness of breath dyspnea 06/2015  . Sleep apnea  USES CPAP  . Weakness generalized 06/2015    Past Surgical History:  Procedure Laterality Date  . CARDIAC CATHETERIZATION N/A 07/01/2015   Procedure: Left Heart Cath and Coronary Angiography;  Surgeon: Orpah Cobb, MD;  Location: [facility];  Service: Cardiovascular;  Laterality: N/A;  . GANGLION CYST EXCISION     right wrist  . KNEE SURGERY     right   . TONSILLECTOMY      Family History  Problem Relation Age of Onset  . Hypertension Father   . Skin cancer Father   . Rheum arthritis Mother   . Allergies Sister   . Lung disease Neg Hx     Social History   Social History  .  Marital status: Single    Spouse name: N/A  . Number of children: N/A  . Years of education: N/A   Occupational History  . retired    Social History Main Topics  . Smoking status: Former Smoker    Packs/day: 1.00    Years: 20.00    Types: Cigarettes    Start date: 12/10/1964    Quit date: 01/03/1983  . Smokeless tobacco: Never Used     Comment: smoked off & on  . Alcohol use No  . Drug use: No  . Sexual activity: Not Asked   Other Topics Concern  . None   Social History Narrative   Originally from [facility] [place] [facility] [place]. Moved to [place] in 1969. Previously has traveled over the [address] as well as [place]. No international travel. He has a degree in accounting & business administration. 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 He has no bird exposure. Previously did have mold problems in his father's home where he was helping to care for him. 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 He served in the [employer] for 6 years.       Objective:   Physical Exam BP 114/66 (BP Location: Left Arm, Cuff Size: Normal)   Pulse 62   Ht 5\' 10"  (1.778 m)   Wt 209 lb 9.6 oz (95.1 kg)   SpO2 95%   BMI 30.07 kg/m  General:  Awake. Alert. No acute distress. Mild central obesity. Integument:  Warm & dry. No rash on exposed skin. No bruising. Lymphatics:  No appreciated cervical or supraclavicular lymphadenoapthy. HEENT:  Moist mucus membranes. No oral ulcers. No scleral injection or icterus.  Cardiovascular:  Regular rate & rhythm. No edema. No appreciable JVD.  Pulmonary:  Good aeration & clear to auscultation bilaterally. Symmetric chest wall expansion. No accessory muscle use on room air. Abdomen: Soft. Normal bowel sounds. Protuberant. Grossly nontender. Musculoskeletal:  Normal bulk and tone. Hand grip, biceps, triceps, deltoids, hip, hamstring, and quadriceps strength  5/5 bilaterally. No joint deformity or effusion appreciated. Neurological:  CN 2-12 grossly in tact. No meningismus. Moving all 4 extremities equally. Symmetric brachioradialis deep tendon reflexes. Psychiatric:  Mood and affect congruent. Speech normal rhythm, rate & tone.   IMAGING CTA CHEST 06/30/15 (personally reviewed by me): L-spine nodules left upper lung as well as calcified lymph nodes within left hilum and mediastinum. Calcifications also noted within spleen. No other parenchymal nodule or opacity appreciated. No pleural effusion or thickening. No pericardial effusion. No pathologic mediastinal adenopathy. Mild dependent atelectasis. No pulmonary emboli.  CARDIAC TTE (07/01/15): LV normal in size with mild concentric  hypertrophy. EF 65-70%. Grade 1 diastolic dysfunction without regional wall motion abnormality. LA moderately dilated & RA normal in size. RV normal in size and function. No aortic stenosis or regurgitation. Mild dilatation of ascending aorta. No mitral stenosis or regurgitation. No pulmonic stenosis. Trivial tricuspid regurgitation. No pericardial effusion.  LHC (07/01/15):  Ost RCA to Prox RCA lesion, 20% stenosed. The lesion was not previously treated.  Ost Cx to Prox Cx lesion, 15% stenosed. The lesion was not previously treated.  Ost LAD to Prox LAD lesion, 15% stenosed. The lesion was not previously treated.  The left ventricular systolic function is normal.  LABS 07/01/15 CBC: 8.2/13.5/40.6/235 BMP: 140/3.8/108/28/17/1.09/104/8.6    Assessment & Plan:  70 y.o. male with ongoing dyspnea and chest discomfort. As patient's symptoms seem to be primarily occurring at rest and he denies any significant worsening with exertion walking on either his treadmill or on level ground I am suspicious that there could be an alternative etiology beyond an underlying parenchymal lung disease. Certainly with his elevated CPK a parenchymal lung disease is possible. I reviewed his  chest CT scan and other than for dependent atelectasis I can find no overt signs of interstitial lung disease. Certainly with his prior history of tobacco use COPD is a possibility but again he does not have a history of recurrent bronchitis or other symptoms that would suggest this other than for wheezing. I do question whether or not he may be having underlying reflux but given absence of symptomatic benefit from Protonix I'm somewhat skeptical. The patient wishes to defer a barium swallow at this time due to cost. I instructed the patient contact my office if he had any new breathing problems or questions before his next appointment.  1. Dyspnea:  Checking full pulmonary function testing, 6 minute walk test on room air, and 72 hour Holter monitor. 2. Eructation:  Deferring esophagogram at this time due to cost. 3. Elevated CK:  Following with rheumatology. Planned for EMG testing. Has not yet had muscle biopsy. 4. Follow-up: Return to clinic in 6 weeks or sooner if needed.  Donna ChristenJennings E. Jamison NeighborNestor, M.D. [facility] Pager:  [phone] After 3pm or if no response, call [phone] 11:44 AM 09/08/15

## 2015-09-08 NOTE — Patient Instructions (Addendum)
   Call me if you notice any new breathing problems before your next appointment.   I will see you back in 6 week or sooner if needed to review your test results.  TESTS ORDERED: 1. Holter Monitor 2. Full PFTs 3. on Room Air

## 2015-09-14 ENCOUNTER — Ambulatory Visit (INDEPENDENT_AMBULATORY_CARE_PROVIDER_SITE_OTHER): Payer: Medicare HMO | Admitting: Neurology

## 2015-09-14 DIAGNOSIS — R748 Abnormal levels of other serum enzymes: Secondary | ICD-10-CM

## 2015-09-14 DIAGNOSIS — M6281 Muscle weakness (generalized): Secondary | ICD-10-CM

## 2015-09-14 NOTE — Procedures (Signed)
[facility]  [address], [address]  [place], [place] [address] Tel: [phone] Fax:  [phone] Test Date:  09/14/2015  Patient: Kenneth Dyer DOB: 03/14/1945 Physician: Nita Sickleonika Patel, DO  Sex: Male Height: 5\' 10"  Ref Phys: Dr Dierdre ForthBeekman  ID#: [id number] Temp: 33.8C Technician: Judie PetitM. Dean   Patient Complaints: This is a 70 year old gentleman referred for evaluation of generalized muscle weakness and mild elevation in CK.  NCV & EMG Findings: Extensive electrodiagnostic testing of the right upper and lower extremity shows: 1. All sensory responses including the right median, ulnar, sural, and superficial peroneal nerves are within normal limits. 2. The right ulnar motor response at the abductor digit minimi shows mildly reduced amplitude, however recording at the first dorsal interosseous, the motor amplitude is normal. The right median motor responses also within normal limits.  3. Right peroneal (EDB) and tibial (AH) motor responses show reduced amplitude; however, the motor response at the tibialis anterior is within normal limits.  In isolation, these findings are most likely age related and due to localized compression from shoe wearing. 4. In the right upper extremity, chronic motor axon loss changes are seen affecting the C8 myotomes, without accompanied active denervation. 5. There is no evidence of complex, small, motor unit action potentials affecting any of the tested muscles.   Impression: 1. 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 2. There is no evidence of a diffuse myopathy or lumbosacral radiculopathy.   ___________________________ Nita Sickleonika Patel, DO    Nerve Conduction Studies Anti Sensory Summary Table   Site NR Peak (ms) Norm Peak (ms) P-T Amp (V) Norm P-T Amp  Right Median Anti Sensory (2nd Digit)  33.8C  Wrist    3.3 <3.8 15.0 >10  Right Sup Peroneal Anti Sensory (Ant Lat Mall)  12 cm    3.4 <4.6  5.1 >3  Right Sural Anti Sensory (Lat Mall)  Calf    3.9 <4.6 4.0 >3  Right Ulnar Anti Sensory (5th Digit)  33.8C  Wrist    3.1 <3.2 9.9 >5   Motor Summary Table   Site NR Onset (ms) Norm Onset (ms) O-P Amp (mV) Norm O-P Amp Site1 Site2 Delta-0 (ms) Dist (cm) Vel (m/s) Norm Vel (m/s)  Right Median Motor (Abd Poll Brev)  33.8C  Wrist    3.4 <4.0 8.2 >5 Elbow Wrist 4.6 25.0 54 >50  Elbow    8.0  7.8         Right Peroneal Motor (Ext Dig Brev)  Ankle    4.8 <6.0 1.5 >2.5 B Fib Ankle 8.9 36.0 40 >40  B Fib    13.7  1.2  Poplt B Fib 2.3 10.0 43 >40  Poplt    16.0  1.1         Right Peroneal TA Motor (Tib Ant)  Fib Head    2.3 <4.5 4.3 >3 Poplit Fib Head 2.2 10.0 45 >40  Poplit    4.5  3.9         Right Tibial Motor (Abd Hall Brev)  Ankle    3.7 <6.0 1.5 >4 Knee Ankle 11.4 41.0 36 >40  Knee    15.1  0.7         Right Ulnar Motor (Abd Dig Minimi)  33.8C  Wrist    2.9 <3.1 6.8 >7 B Elbow Wrist 4.2 22.0 52 >50  B Elbow    7.1  5.9  A Elbow B Elbow 1.7 10.0 59 >50  A Elbow    8.8  5.5         Right Ulnar (FDI) Motor (1st DI)  33.8C  Wrist    4.5 <4.5 9.8 >7         Comparison Summary Table   Site NR Peak (ms) Norm Peak (ms) P-T Amp (V) Site1 Site2 Delta-P (ms) Norm Delta (ms)  Right Median/Ulnar Palm Comparison (Wrist - 8cm)  33.8C  Median Palm    1.9 <2.2 67.8 Median Palm Ulnar Palm 0.1   Ulnar Palm    1.8 <2.2 17.8       EMG   Side Muscle Ins Act Fibs Psw Fasc Number Recrt Dur Dur. Amp Amp. Poly Poly. Comment  Right Flex Dig Long Nml Nml Nml Nml Nml Nml Nml Nml Nml Nml Nml Nml N/A  Right AntTibialis Nml Nml Nml Nml Nml Nml Nml Nml Nml Nml Nml Nml N/A  Right Gastroc Nml Nml Nml Nml Nml Nml Nml Nml Nml Nml Nml Nml N/A  Right BicepsFemS Nml Nml Nml Nml Nml Nml Nml Nml Nml Nml Nml Nml N/A  Right RectFemoris Nml Nml Nml Nml Nml Nml Nml Nml Nml Nml Nml Nml N/A  Right GluteusMed Nml Nml Nml Nml Nml Nml Nml Nml Nml Nml Nml Nml N/A  Right Biceps Nml Nml Nml Nml Nml Nml Nml Nml Nml  Nml Nml Nml N/A  Right Infraspinatus Nml Nml Nml Nml Nml Nml Nml Nml Nml Nml Nml Nml N/A  Right PronatorTeres Nml Nml Nml Nml Nml Nml Nml Nml Nml Nml Nml Nml N/A  Right Cervical Parasp Low Nml Nml Nml Nml Nml Nml Nml Nml Nml Nml Nml Nml N/A  Right Deltoid Nml Nml Nml Nml Nml Nml Nml Nml Nml Nml Nml Nml N/A  Right FlexPolLong Nml Nml Nml Nml 1- Rapid Some 1+ Some 1+ Nml Nml N/A  Right 1stDorInt Nml Nml Nml Nml 1- Rapid Some 1+ Some 1+ Nml Nml N/A  Right Triceps Nml Nml Nml Nml 1- Rapid Some 1+ Some 1+ Nml Nml N/A      Waveforms:

## 2015-09-20 ENCOUNTER — Ambulatory Visit: Payer: Medicare HMO

## 2015-09-20 ENCOUNTER — Telehealth: Payer: Self-pay | Admitting: Pulmonary Disease

## 2015-09-20 NOTE — Telephone Encounter (Signed)
Kenneth Dyer with [facility] calling asking for another diagnosis for the event monitor order placed. Insurance will not cover "dyspnea" for the 30-day event monitor. Need another Dx associated. Kenneth AddisonKatie states that covered alternative dx are Syncope, dizziness, tachycardia, bradycardia and palpitations.  There is nothing similar listed on patient's problem list and there is nothing clearly mentioned in the chart stating any of the above issues.  There was mention of the patient having some "mild instability with turning quickly" - could this be classified as any of the above?  Please advise Kenneth Dyer as the Kenneth Dyer is off this afternoon and the patient has an appt to be set up with monitor today at 3pm. Thanks.

## 2015-09-20 NOTE — Telephone Encounter (Signed)
Patients appt was canceled until clarification on diagnosis code and type of monitor.

## 2015-09-20 NOTE — Telephone Encounter (Signed)
Per Dr Vassie LollAlva, pt will need to reschedule the appt for the event monitor d/t Dr Jamison NeighborNestor not being available to addend his notes. Called and LM for Katie at [facility].

## 2015-09-20 NOTE — Telephone Encounter (Signed)
LM for Kenneth Dyer x 1

## 2015-09-22 NOTE — Telephone Encounter (Signed)
JN please advise 

## 2015-09-22 NOTE — Telephone Encounter (Signed)
Please ask the patient if he is having any palpitations or fluttering in his chest. If he does then we can use this as the diagnosis if "dyspnea" is not sufficient. Thanks.

## 2015-09-22 NOTE — Telephone Encounter (Signed)
lmtcb X1 for pt  

## 2015-09-23 NOTE — Telephone Encounter (Signed)
LMTCB x1 for Abbott Laboratorieskatie

## 2015-09-23 NOTE — Telephone Encounter (Signed)
atc pt, line rang several times with no answer, no vm.  Wcb.  

## 2015-09-23 NOTE — Telephone Encounter (Signed)
Spoke with pt, denies any heart palpitations, fluttering in chest, chest tightness or pain, dizziness, syncope, tachycardia or bradycardia- pt states his only symptom is his shortness of breath.  JN please advise.  Thanks!

## 2015-09-23 NOTE — Telephone Encounter (Signed)
If they won't cover they won't cover. Nothing I can do.

## 2015-09-23 NOTE — Telephone Encounter (Signed)
Patient calling back - He left his cell phone number [phone]

## 2015-09-24 NOTE — Telephone Encounter (Signed)
Pt returned call Advised of the order being cancelled Pt voiced his understanding He did want his PFT/6MW and ov moved up but there are no openings Will sign off

## 2015-09-24 NOTE — Telephone Encounter (Signed)
Spoke with Katie @ [facility] to let her know that Paula LibraJN has no other dx code to offer. Order cancelled.   LMTCB for pt to let him know.

## 2015-10-12 DIAGNOSIS — H5203 Hypermetropia, bilateral: Secondary | ICD-10-CM | POA: Diagnosis not present

## 2015-10-12 DIAGNOSIS — H52223 Regular astigmatism, bilateral: Secondary | ICD-10-CM | POA: Diagnosis not present

## 2015-10-12 DIAGNOSIS — H25813 Combined forms of age-related cataract, bilateral: Secondary | ICD-10-CM | POA: Diagnosis not present

## 2015-10-15 DIAGNOSIS — G4733 Obstructive sleep apnea (adult) (pediatric): Secondary | ICD-10-CM | POA: Diagnosis not present

## 2015-10-20 DIAGNOSIS — L570 Actinic keratosis: Secondary | ICD-10-CM | POA: Diagnosis not present

## 2015-10-29 DIAGNOSIS — Z6834 Body mass index (BMI) 34.0-34.9, adult: Secondary | ICD-10-CM | POA: Diagnosis not present

## 2015-10-29 DIAGNOSIS — Z0000 Encounter for general adult medical examination without abnormal findings: Secondary | ICD-10-CM | POA: Diagnosis not present

## 2015-10-29 DIAGNOSIS — I10 Essential (primary) hypertension: Secondary | ICD-10-CM | POA: Diagnosis not present

## 2015-11-15 ENCOUNTER — Ambulatory Visit (INDEPENDENT_AMBULATORY_CARE_PROVIDER_SITE_OTHER): Payer: Medicare HMO | Admitting: Pulmonary Disease

## 2015-11-15 DIAGNOSIS — R0600 Dyspnea, unspecified: Secondary | ICD-10-CM | POA: Diagnosis not present

## 2015-11-15 LAB — PULMONARY FUNCTION TEST
DL/VA % pred: 123 %
DL/VA: 5.66 ml/min/mmHg/L
DLCO COR % PRED: 111 %
DLCO UNC: 36.92 ml/min/mmHg
DLCO cor: 35.17 ml/min/mmHg
DLCO unc % pred: 116 %
FEF 25-75 POST: 2.83 L/s
FEF 25-75 Pre: 2.27 L/sec
FEF2575-%Change-Post: 24 %
FEF2575-%Pred-Post: 118 %
FEF2575-%Pred-Pre: 95 %
FEV1-%CHANGE-POST: 5 %
FEV1-%Pred-Post: 100 %
FEV1-%Pred-Pre: 95 %
FEV1-PRE: 3.02 L
FEV1-Post: 3.18 L
FEV1FVC-%Change-Post: 4 %
FEV1FVC-%PRED-PRE: 102 %
FEV6-%Change-Post: 1 %
FEV6-%PRED-POST: 99 %
FEV6-%PRED-PRE: 98 %
FEV6-POST: 4.03 L
FEV6-PRE: 3.98 L
FEV6FVC-%CHANGE-POST: 0 %
FEV6FVC-%PRED-POST: 105 %
FEV6FVC-%PRED-PRE: 105 %
FVC-%CHANGE-POST: 0 %
FVC-%PRED-POST: 94 %
FVC-%PRED-PRE: 93 %
FVC-POST: 4.07 L
FVC-PRE: 4.04 L
POST FEV6/FVC RATIO: 99 %
PRE FEV6/FVC RATIO: 99 %
Post FEV1/FVC ratio: 78 %
Pre FEV1/FVC ratio: 75 %
RV % PRED: 53 %
RV: 1.29 L
TLC % PRED: 95 %
TLC: 6.64 L

## 2015-11-15 NOTE — Progress Notes (Signed)
PFT 11/15/15: FVC 4.04 L (93%) FEV1 3.02 L (95%) FEV1/FVC 0.75 FEF 25-75 2.27 L (95%) negative bronchodilator response TLC 6.64 L (95%) RV 53% ERV 63% DLCO corrected 111% (Hgb 16.5)

## 2015-11-17 ENCOUNTER — Ambulatory Visit (INDEPENDENT_AMBULATORY_CARE_PROVIDER_SITE_OTHER): Payer: Medicare HMO | Admitting: Pulmonary Disease

## 2015-11-17 ENCOUNTER — Encounter: Payer: Self-pay | Admitting: Pulmonary Disease

## 2015-11-17 VITALS — BP 134/80 | HR 64 | Ht 70.0 in | Wt 207.0 lb

## 2015-11-17 DIAGNOSIS — R0602 Shortness of breath: Secondary | ICD-10-CM

## 2015-11-17 DIAGNOSIS — R0600 Dyspnea, unspecified: Secondary | ICD-10-CM

## 2015-11-17 DIAGNOSIS — R0609 Other forms of dyspnea: Secondary | ICD-10-CM | POA: Diagnosis not present

## 2015-11-17 DIAGNOSIS — Z9989 Dependence on other enabling machines and devices: Secondary | ICD-10-CM | POA: Diagnosis not present

## 2015-11-17 DIAGNOSIS — G4733 Obstructive sleep apnea (adult) (pediatric): Secondary | ICD-10-CM

## 2015-11-17 NOTE — Patient Instructions (Signed)
   Call me if you notice any worsening in your breathing or have new problems with it before your next appointment.  I will see you back in 6 months or sooner if needed.

## 2015-11-17 NOTE — Progress Notes (Signed)
Subjective:    Patient ID: Kenneth Dyer, male    DOB: 05/11/1945, 70 y.o.   MRN: [id number]  C.C.:  Follow-up for Dyspnea & OSA.  HPI Dyspnea:  He reports his dyspnea is unchanged. He reports dyspnea that is mild and requires a deep, sighing breath. He reports he can induce wheezing but denies any chronic wheezing. He reports he exercises 5 days weekly. He does some weight/strength training in both his upper and lower body. He does approximately 45 minutes on a level grade for approximately 3.5 miles. He denies any dyspnea on the treadmill.   OSA:  He reports his quality of sleep has improved significantly since starting CPAP. Denies any daytime napping.   Review of Systems Denies any chest tightness or pain. No fever, chills, or sweats. Denies any myalgias or arthralgias. Has only occasional leg cramping.   No Known Allergies  Current Outpatient Prescriptions on File Prior to Visit  Medication Sig Dispense Refill  . ALPRAZolam (XANAX) 0.5 MG tablet Take 0.25 mg by mouth as needed for anxiety. Reported on 06/30/2015    . amLODipine (NORVASC) 5 MG tablet Take 5 mg by mouth daily.    . Ascorbic Acid (VITAMIN C) 1000 MG tablet Take 1 tablet (1,000 mg total) by mouth daily.    Marland Kitchen. aspirin 81 MG tablet Take 81 mg by mouth every other day.    . Cholecalciferol (VITAMIN D3) 1000 units CAPS Take 1,000 Units by mouth daily.     . Coenzyme Q10 (COQ10) 200 MG CAPS Take 1 capsule by mouth daily.    Marland Kitchen. lisinopril (PRINIVIL,ZESTRIL) 20 MG tablet Take 20 mg by mouth daily.    . Multiple Vitamin (MULTIVITAMIN) tablet Take 1 tablet by mouth daily. Over 50    . Multiple Vitamins-Minerals (VISION FORMULA/LUTEIN PO) Take 1 tablet by mouth daily.     . Omega-3 Fatty Acids (FISH OIL PO) Take 2,600 mg by mouth daily.     . pantoprazole (PROTONIX) 40 MG tablet Take 1 tablet (40 mg total) by mouth daily. 30 tablet 3   No current facility-administered medications on file prior to visit.     Past Medical  History:  Diagnosis Date  . Hypertension   . Shortness of breath dyspnea 06/2015  . Sleep apnea    USES CPAP  . Weakness generalized 06/2015    Past Surgical History:  Procedure Laterality Date  . CARDIAC CATHETERIZATION N/A 07/01/2015   Procedure: Left Heart Cath and Coronary Angiography;  Surgeon: Orpah CobbAjay Kadakia, MD;  Location: [facility];  Service: Cardiovascular;  Laterality: N/A;  . GANGLION CYST EXCISION     right wrist  . KNEE SURGERY     right   . TONSILLECTOMY      Family History  Problem Relation Age of Onset  . Hypertension Father   . Skin cancer Father   . Rheum arthritis Mother   . Allergies Sister   . Lung disease Neg Hx     Social History   Social History  . Marital status: Single    Spouse name: N/A  . Number of children: N/A  . Years of education: N/A   Occupational History  . retired    Social History Main Topics  . Smoking status: Former Smoker    Packs/day: 1.00    Years: 20.00    Types: Cigarettes    Start date: 12/10/1964    Quit date: 01/03/1983  . Smokeless tobacco: Never Used     Comment: smoked  off & on  . Alcohol use No  . Drug use: No  . Sexual activity: Not Asked   Other Topics Concern  . None   Social History Narrative   Originally from [facility] [place]. Moved to [place] in 1969. Previously has traveled over the [address] as well as [place]. No international travel. He has a degree in accounting & business administration. 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 He has no bird exposure. Previously did have mold problems in his father's home where he was helping to care for him. 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 He served in the [employer] for 6 years.       Objective:   Physical Exam BP 134/80 (BP Location: Left Arm, Cuff Size: Normal)   Pulse 64   Ht 5\' 10"  (1.778 m)   Wt 207 lb (93.9 kg)    SpO2 94%   BMI 29.70 kg/m  General:  Awake. Alert. Mild central obesity. Integument:  Warm & dry. No rash on exposed skin.  Lymphatics:  No appreciated cervical or supraclavicular lymphadenoapthy. HEENT:  Moist mucus membranes. No scleral injection or icterus.  Cardiovascular:  Regular rate & rhythm. No edema. Normal S1 & S2.  Pulmonary:  Speaking in complete sentences and clear on auscultation. Normal work of breathing on room air. Musculoskeletal:  No joint effusion. Strength symmetric and 5/5.  PFT 11/15/15: FVC 4.04 L (93%) FEV1 3.02 L (95%) FEV1/FVC 0.75 FEF 25-75 2.27 L (95%) negative bronchodilator response TLC 6.64 L (95%) RV 53% ERV 63% DLCO corrected 111% (Hgb 16.5)  11/17/15:  Walked 501 meters / Baseline Sat 94% on RA / Nadir Sat 94% on RA @ rest  IMAGING CTA CHEST 06/30/15 (personally reviewed by me): Calcified nodules left upper lung as well as calcified lymph nodes within left hilum and mediastinum. Calcifications also noted within spleen. No other parenchymal nodule or opacity appreciated. No pleural effusion or thickening. No pericardial effusion. No pathologic mediastinal adenopathy. Mild dependent atelectasis. No pulmonary emboli.  CARDIAC TTE (07/01/15): LV normal in size with mild concentric hypertrophy. EF 65-70%. Grade 1 diastolic dysfunction without regional wall motion abnormality. LA moderately dilated & RA normal in size. RV normal in size and function. No aortic stenosis or regurgitation. Mild dilatation of ascending aorta. No mitral stenosis or regurgitation. No pulmonic stenosis. Trivial tricuspid regurgitation. No pericardial effusion.  LHC (07/01/15):  Ost RCA to Prox RCA lesion, 20% stenosed. The lesion was not previously treated.  Ost Cx to Prox Cx lesion, 15% stenosed. The lesion was not previously treated.  Ost LAD to Prox LAD lesion, 15% stenosed. The lesion was not previously treated.  The left ventricular systolic function is  normal.  LABS 07/01/15 CBC: 8.2/13.5/40.6/235 BMP: 140/3.8/108/28/17/1.09/104/8.6    Assessment & Plan:  70 y.o. male with dyspnea & OSA on CPAP therapy. Reviewed patient's 6 minute walk test and pulmonary function testing both of which are completely normal without a significant bronchodilator response. Patient has a normal level of activity with his daily exercise regimen. Therefore, I do not feel further testing is necessary at this time. He does seem to have excellent compliance with his home CPAP therapy and a good response to treatment with CPAP. I instructed the patient to notify me if he develops any new or worsening breathing problems before his next appointment. If he  feels he is declining the next step would be cardiopulmonary exercise testing.  1. Dyspnea:  Mild. Likely some element of deconditioning. No further testing for now.  2. OSA:  Continuing CPAP therapy indefinitely. 3. Health Maintenance:  Declines influenza vaccine. 4. Follow-up: Return to clinic in 6 months or sooner if needed.   Donna ChristenJennings E. Jamison NeighborNestor, M.D. [facility] Pager:  [phone] After 3pm or if no response, call [phone] 3:23 PM 11/17/15

## 2015-12-01 DIAGNOSIS — M6281 Muscle weakness (generalized): Secondary | ICD-10-CM | POA: Diagnosis not present

## 2016-02-04 DIAGNOSIS — H5203 Hypermetropia, bilateral: Secondary | ICD-10-CM | POA: Diagnosis not present

## 2016-02-04 DIAGNOSIS — H532 Diplopia: Secondary | ICD-10-CM | POA: Diagnosis not present

## 2016-02-04 DIAGNOSIS — H52223 Regular astigmatism, bilateral: Secondary | ICD-10-CM | POA: Diagnosis not present

## 2016-02-20 ENCOUNTER — Encounter: Payer: Self-pay | Admitting: Adult Health

## 2016-02-21 ENCOUNTER — Encounter: Payer: Self-pay | Admitting: Adult Health

## 2016-02-21 ENCOUNTER — Ambulatory Visit (INDEPENDENT_AMBULATORY_CARE_PROVIDER_SITE_OTHER): Payer: Medicare HMO | Admitting: Adult Health

## 2016-02-21 DIAGNOSIS — E039 Hypothyroidism, unspecified: Secondary | ICD-10-CM | POA: Diagnosis not present

## 2016-02-21 DIAGNOSIS — H532 Diplopia: Secondary | ICD-10-CM | POA: Diagnosis not present

## 2016-02-21 DIAGNOSIS — G4733 Obstructive sleep apnea (adult) (pediatric): Secondary | ICD-10-CM | POA: Diagnosis not present

## 2016-02-21 DIAGNOSIS — R748 Abnormal levels of other serum enzymes: Secondary | ICD-10-CM | POA: Diagnosis not present

## 2016-02-21 DIAGNOSIS — I10 Essential (primary) hypertension: Secondary | ICD-10-CM | POA: Diagnosis not present

## 2016-02-21 NOTE — Assessment & Plan Note (Signed)
Controlled on CPAP   Plan  Patient Instructions  Continue on C Pap at bedtime Do not drive when sleepy Follow-up with Dr. Vassie LollAlva in 1 year and as needed

## 2016-02-21 NOTE — Progress Notes (Signed)
@Patient  ID: Kenneth Dyer, male    DOB: 1945-02-25, 71 y.o.   MRN: [id number]  Chief Complaint  Patient presents with  . Follow-up    OSA     Referring provider: No ref. provider found  HPI: 71 yo male followed for severe OSA   02/21/2016 Follow up : OSA  Pt returns for 1 year follow up for severe OSA  Says he is doing well on CPAP . Feels rested w/ no daytime sleepiness.  Wears it every night for ~8 hrs Exercises daily .  Download shows excellent compliance with average usage at 8 hours on a set pressure of 12 cm of H2O AHI 4.2. Min leaks .    No Known Allergies  Immunization History  Administered Date(s) Administered  . Influenza Split 10/02/2012, 10/02/2013    Past Medical History:  Diagnosis Date  . Hypertension   . Shortness of breath dyspnea 06/2015  . Sleep apnea    USES CPAP  . Weakness generalized 06/2015    Tobacco History: History  Smoking Status  . Former Smoker  . Packs/day: 1.00  . Years: 20.00  . Types: Cigarettes  . Start date: 12/10/1964  . Quit date: 01/03/1983  Smokeless Tobacco  . Never Used    Comment: smoked off & on   Counseling given: Not Answered   Outpatient Encounter Prescriptions as of 02/21/2016  Medication Sig  . ALPRAZolam (XANAX) 0.5 MG tablet Take 0.25 mg by mouth as needed for anxiety. Reported on 06/30/2015  . amLODipine (NORVASC) 5 MG tablet Take 5 mg by mouth daily.  . Ascorbic Acid (VITAMIN C) 1000 MG tablet Take 1 tablet (1,000 mg total) by mouth daily.  Marland Kitchen aspirin 81 MG tablet Take 81 mg by mouth every other day.  . Cholecalciferol (VITAMIN D3) 1000 units CAPS Take 1,000 Units by mouth daily.   . Coenzyme Q10 (COQ10) 200 MG CAPS Take 1 capsule by mouth daily.  Marland Kitchen lisinopril (PRINIVIL,ZESTRIL) 20 MG tablet Take 20 mg by mouth daily.  . Multiple Vitamin (MULTIVITAMIN) tablet Take 1 tablet by mouth daily. Over 50  . Multiple Vitamins-Minerals (VISION FORMULA/LUTEIN PO) Take 1 tablet by mouth daily.   . Omega-3 Fatty Acids  (FISH OIL PO) Take 2,600 mg by mouth daily.   . pantoprazole (PROTONIX) 40 MG tablet Take 1 tablet (40 mg total) by mouth daily.   No facility-administered encounter medications on file as of 02/21/2016.      Review of Systems  Constitutional:   No  weight loss, night sweats,  Fevers, chills, fatigue, or  lassitude.  HEENT:   No headaches,  Difficulty swallowing,  Tooth/dental problems, or  Sore throat,                No sneezing, itching, ear ache, nasal congestion, post nasal drip,   CV:  No chest pain,  Orthopnea, PND, swelling in lower extremities, anasarca, dizziness, palpitations, syncope.   GI  No heartburn, indigestion, abdominal pain, nausea, vomiting, diarrhea, change in bowel habits, loss of appetite, bloody stools.   Resp: No shortness of breath with exertion or at rest.  No excess mucus, no productive cough,  No non-productive cough,  No coughing up of blood.  No change in color of mucus.  No wheezing.  No chest wall deformity  Skin: no rash or lesions.  GU: no dysuria, change in color of urine, no urgency or frequency.  No flank pain, no hematuria   MS:  No joint pain or swelling.  No decreased range of motion.  No back pain.    Physical Exam  BP 128/80 (BP Location: Left Arm, Cuff Size: Normal)   Pulse 72   Ht 5\' 10"  (1.778 m)   Wt 94.3 kg (208 lb)   SpO2 95%   BMI 29.84 kg/m   GEN: A/Ox3; pleasant , NAD, well nourished    HEENT:  [place]/AT,  EACs-clear, TMs-wnl, NOSE-clear, THROAT-clear, no lesions, no postnasal drip or exudate noted. Class 2 MP airway   NECK:  Supple w/ fair ROM; no JVD; normal carotid impulses w/o bruits; no thyromegaly or nodules palpated; no lymphadenopathy.    RESP  Clear  P & A; w/o, wheezes/ rales/ or rhonchi. no accessory muscle use, no dullness to percussion  CARD:  RRR, no m/r/g, no peripheral edema, pulses intact, no cyanosis or clubbing.  GI:   Soft & nt; nml bowel sounds; no organomegaly or masses detected.   Musco: Warm bil,  no deformities or joint swelling noted.   Neuro: alert, no focal deficits noted.    Skin: Warm, no lesions or rashes   Lab Results:  CBC    Component Value Date/Time   WBC 8.2 07/01/2015 0215   RBC 4.39 07/01/2015 0215   HGB 13.5 07/01/2015 0215   HCT 40.6 07/01/2015 0215   PLT 235 07/01/2015 0215   MCV 92.5 07/01/2015 0215   MCH 30.8 07/01/2015 0215   MCHC 33.3 07/01/2015 0215   RDW 12.4 07/01/2015 0215   LYMPHSABS 1.3 06/30/2015 1827   MONOABS 0.6 06/30/2015 1827   EOSABS 0.2 06/30/2015 1827   BASOSABS 0.1 06/30/2015 1827    BMET    Component Value Date/Time   NA 140 07/01/2015 0216   K 3.8 07/01/2015 0216   CL 108 07/01/2015 0216   CO2 28 07/01/2015 0216   GLUCOSE 104 (H) 07/01/2015 0216   BUN 17 07/01/2015 0216   CREATININE 1.09 07/01/2015 0216   CALCIUM 8.6 (L) 07/01/2015 0216   GFRNONAA >60 07/01/2015 0216   GFRAA >60 07/01/2015 0216    BNP No results found for: BNP  ProBNP No results found for: PROBNP  Imaging: No results found.   Assessment & Plan:   No problem-specific Assessment & Plan notes found for this encounter.     Rubye Oaksammy Courtne Lighty, NP 02/21/2016

## 2016-02-21 NOTE — Patient Instructions (Signed)
Continue on C Pap at bedtime Do not drive when sleepy Follow-up with Dr. Vassie LollAlva in 1 year and as needed

## 2016-02-23 NOTE — Progress Notes (Signed)
Reviewed & agree with plan  

## 2016-02-29 ENCOUNTER — Other Ambulatory Visit: Payer: Self-pay | Admitting: Family Medicine

## 2016-02-29 DIAGNOSIS — H532 Diplopia: Secondary | ICD-10-CM

## 2016-03-09 ENCOUNTER — Ambulatory Visit
Admission: RE | Admit: 2016-03-09 | Discharge: 2016-03-09 | Disposition: A | Discharge status: Home / Self Care | Payer: Medicare HMO | Source: Ambulatory Visit | Attending: Family Medicine | Admitting: Family Medicine

## 2016-03-09 DIAGNOSIS — H532 Diplopia: Secondary | ICD-10-CM

## 2016-03-09 MED ORDER — GADOBENATE DIMEGLUMINE 529 MG/ML IV SOLN
20.0000 mL | Freq: Once | INTRAVENOUS | Status: AC | PRN
  Administered 2016-03-09: 20 mL via INTRAVENOUS

## 2016-04-18 DIAGNOSIS — G4733 Obstructive sleep apnea (adult) (pediatric): Secondary | ICD-10-CM | POA: Diagnosis not present

## 2016-04-20 ENCOUNTER — Telehealth: Payer: Self-pay

## 2016-04-20 DIAGNOSIS — E039 Hypothyroidism, unspecified: Secondary | ICD-10-CM | POA: Diagnosis not present

## 2016-04-24 NOTE — Telephone Encounter (Signed)
Error

## 2016-07-25 DIAGNOSIS — E669 Obesity, unspecified: Secondary | ICD-10-CM | POA: Diagnosis not present

## 2016-07-25 DIAGNOSIS — I10 Essential (primary) hypertension: Secondary | ICD-10-CM | POA: Diagnosis not present

## 2016-07-25 DIAGNOSIS — S40021D Contusion of right upper arm, subsequent encounter: Secondary | ICD-10-CM | POA: Diagnosis not present

## 2016-07-25 DIAGNOSIS — R69 Illness, unspecified: Secondary | ICD-10-CM | POA: Diagnosis not present

## 2016-07-25 DIAGNOSIS — G4730 Sleep apnea, unspecified: Secondary | ICD-10-CM | POA: Diagnosis not present

## 2016-07-25 DIAGNOSIS — E039 Hypothyroidism, unspecified: Secondary | ICD-10-CM | POA: Diagnosis not present

## 2016-07-25 DIAGNOSIS — R233 Spontaneous ecchymoses: Secondary | ICD-10-CM | POA: Diagnosis not present

## 2016-07-25 DIAGNOSIS — Z0000 Encounter for general adult medical examination without abnormal findings: Secondary | ICD-10-CM | POA: Diagnosis not present

## 2016-07-25 DIAGNOSIS — S40022D Contusion of left upper arm, subsequent encounter: Secondary | ICD-10-CM | POA: Diagnosis not present

## 2016-07-25 DIAGNOSIS — Z6830 Body mass index (BMI) 30.0-30.9, adult: Secondary | ICD-10-CM | POA: Diagnosis not present

## 2016-07-27 DIAGNOSIS — G4733 Obstructive sleep apnea (adult) (pediatric): Secondary | ICD-10-CM | POA: Diagnosis not present

## 2016-10-25 DIAGNOSIS — I10 Essential (primary) hypertension: Secondary | ICD-10-CM | POA: Diagnosis not present

## 2016-10-25 DIAGNOSIS — E039 Hypothyroidism, unspecified: Secondary | ICD-10-CM | POA: Diagnosis not present

## 2016-10-25 DIAGNOSIS — Z1211 Encounter for screening for malignant neoplasm of colon: Secondary | ICD-10-CM | POA: Diagnosis not present

## 2016-11-07 DIAGNOSIS — G4733 Obstructive sleep apnea (adult) (pediatric): Secondary | ICD-10-CM | POA: Diagnosis not present

## 2016-11-10 DIAGNOSIS — H52223 Regular astigmatism, bilateral: Secondary | ICD-10-CM | POA: Diagnosis not present

## 2016-11-10 DIAGNOSIS — H5203 Hypermetropia, bilateral: Secondary | ICD-10-CM | POA: Diagnosis not present

## 2016-11-10 DIAGNOSIS — H2513 Age-related nuclear cataract, bilateral: Secondary | ICD-10-CM | POA: Diagnosis not present

## 2016-11-10 DIAGNOSIS — H524 Presbyopia: Secondary | ICD-10-CM | POA: Diagnosis not present

## 2017-03-23 DIAGNOSIS — G4733 Obstructive sleep apnea (adult) (pediatric): Secondary | ICD-10-CM | POA: Diagnosis not present

## 2018-12-03 DEATH — deceased

## 2019-01-24 IMAGING — MR MR HEAD WO/W CM
12 series · 48 of 48 positions shown · IV contrast (multihance)
Comparison: 08/17/2008 MRI of the head.

CLINICAL DATA: 70 y/o  M; diplopia.

Creatinine was obtained on site at [HOSPITAL] at [HOSPITAL].
Results: Creatinine 1.0 mg/dL.
EXAM:
MRI HEAD WITHOUT AND WITH CONTRAST
TECHNIQUE: Multiplanar, multiecho pulse sequences of the brain and surrounding
structures were obtained without and with intravenous contrast.
CONTRAST:  20mL MULTIHANCE GADOBENATE DIMEGLUMINE 529 MG/ML IV SOLN

[Series 5: T1 · sagittal · 4.0mm · 0.75mm/px · 1 of 31 slices shown (1 of 3)]
[im 1/31]
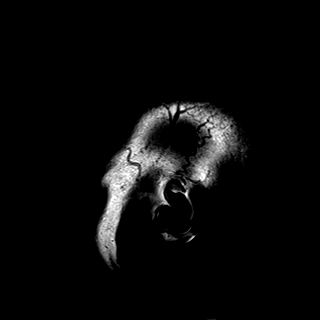

[Series 6: DWI · axial · 3.0mm · 1.44mm/px · z∈[-58,+87]mm · 6 of 90 slices shown (1 of 4)]
[im 1/90]
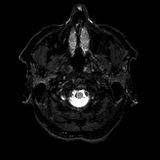
[im 18/90]
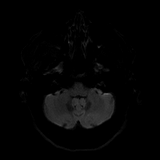
[im 36/90]
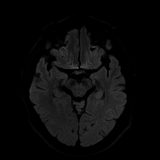
[im 54/90]
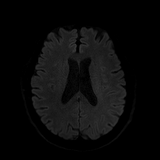
[im 72/90]
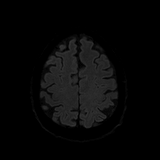
[im 90/90]
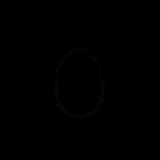

[Series 7: DWI · axial · 3.0mm · 1.44mm/px · z∈[-58,+87]mm · 3 of 45 slices shown (2 of 4)]
[im 1/45]
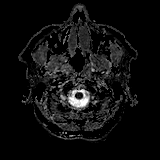
[im 23/45]
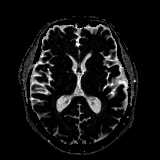
[im 45/45]
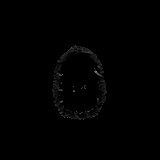

[Series 8: DWI · coronal · 5.0mm · 1.44mm/px · 4 of 64 slices shown (3 of 4)]
[im 1/64]
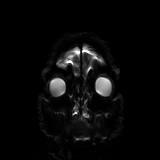
[im 22/64]
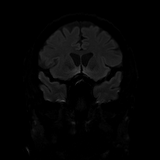
[im 43/64]
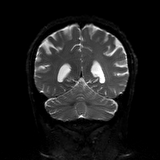
[im 64/64]
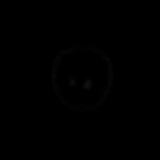

[Series 9: DWI · coronal · 5.0mm · 1.44mm/px · 2 of 32 slices shown (4 of 4)]
[im 1/32]
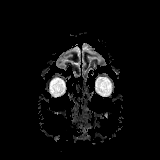
[im 32/32]
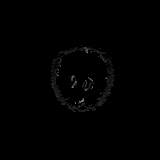

[Series 10: T2 · axial · 4.0mm · 0.36mm/px · z∈[-61,+85]mm · 2 of 29 slices shown]
[im 1/29]
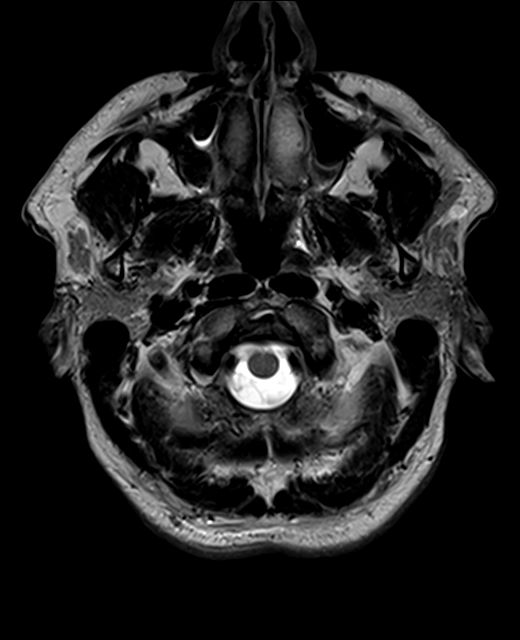
[im 29/29]
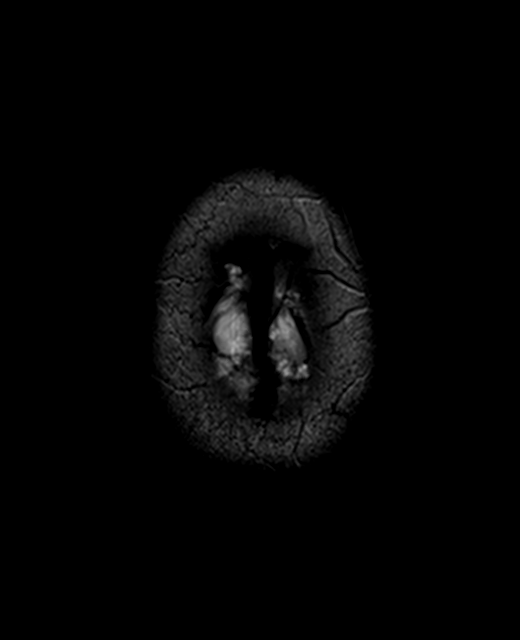

[Series 11: FLAIR · axial · 4.0mm · 0.72mm/px · z∈[-60,+85]mm · 2 of 29 slices shown]
[im 1/29]
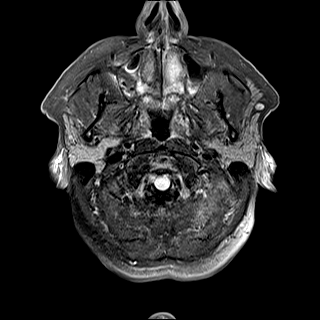
[im 29/29]
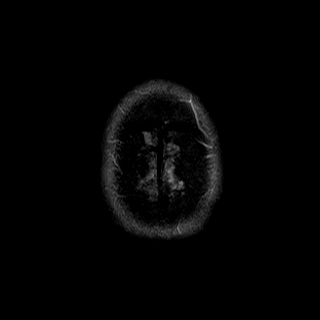

[Series 13: swi_images · axial · 1.5mm · 0.90mm/px · z∈[-59,+83]mm · 6 of 96 slices shown]
[im 1/96]
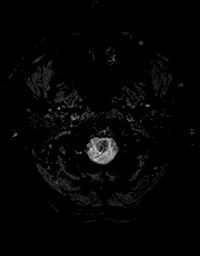
[im 20/96]
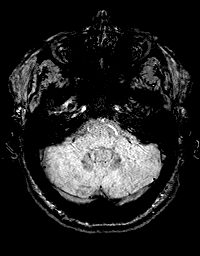
[im 39/96]
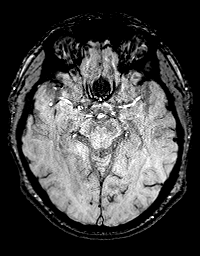
[im 58/96]
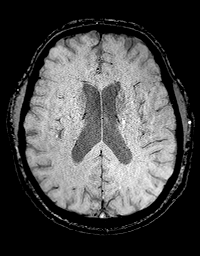
[im 77/96]
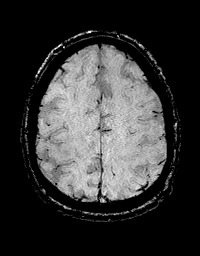
[im 96/96]
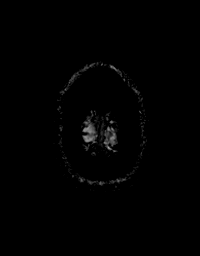

[Series 14: T1 · axial · 1.0mm · 0.90mm/px · z∈[-59,+83]mm · 9 of 144 slices shown (2 of 3)]
[im 1/144]
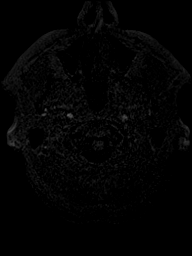
[im 18/144]
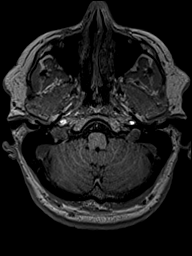
[im 36/144]
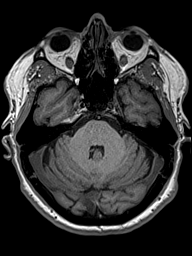
[im 54/144]
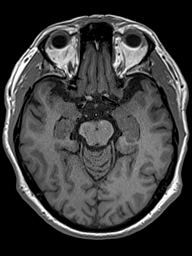
[im 72/144]
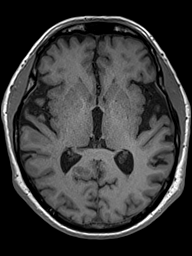
[im 90/144]
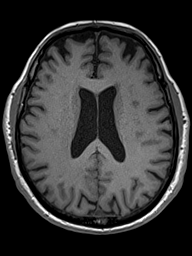
[im 108/144]
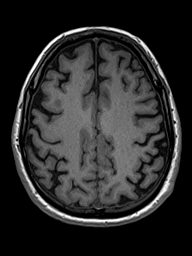
[im 126/144]
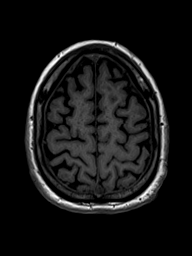
[im 144/144]
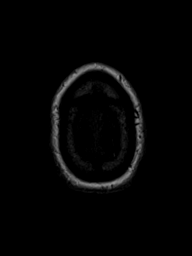

[Series 15: T2 post-contrast · coronal · 4.0mm · 0.36mm/px · 2 of 35 slices shown]
[im 1/35]
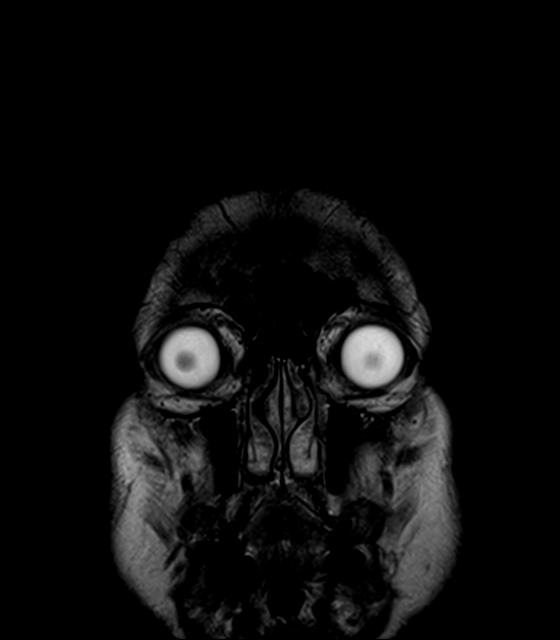
[im 35/35]
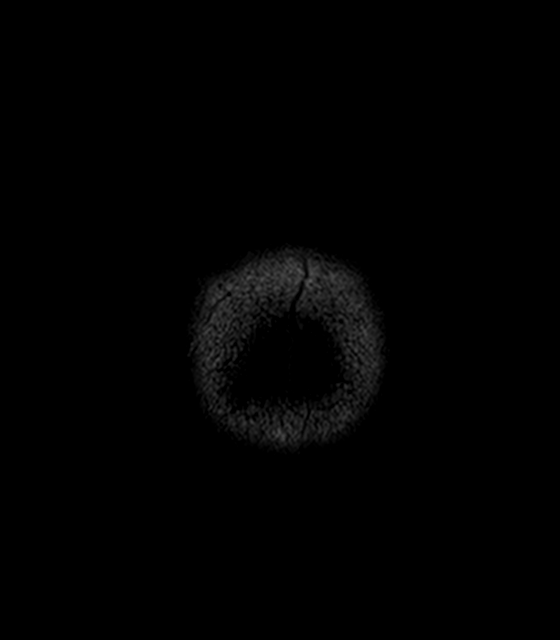

[Series 16: T1 · axial · 1.0mm · 0.90mm/px · z∈[-59,+83]mm · 9 of 144 slices shown (3 of 3)]
[im 1/144]
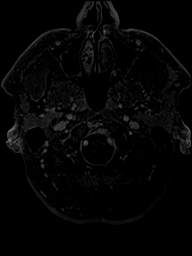
[im 18/144]
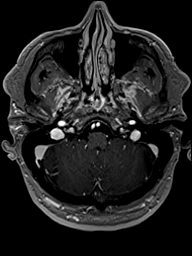
[im 36/144]
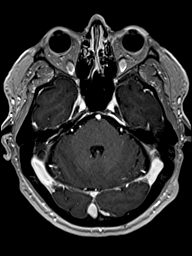
[im 54/144]
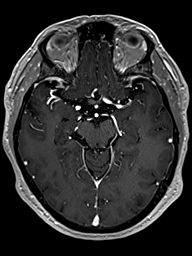
[im 72/144]
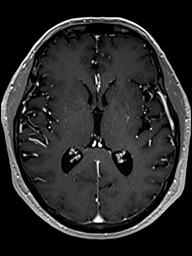
[im 90/144]
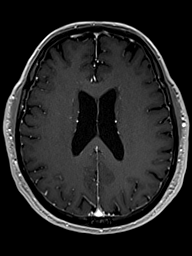
[im 108/144]
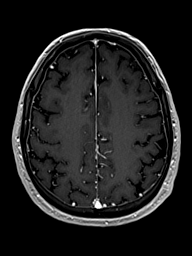
[im 126/144]
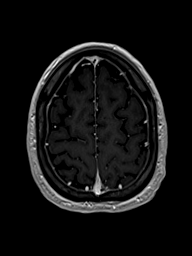
[im 144/144]
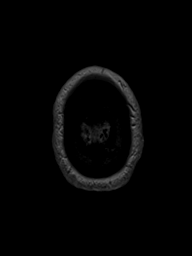

[Series 17: T1 post-contrast · coronal · 4.0mm · 0.72mm/px · 2 of 35 slices shown]
[im 1/35]
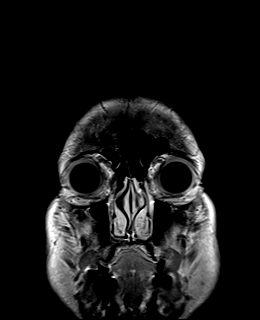
[im 35/35]
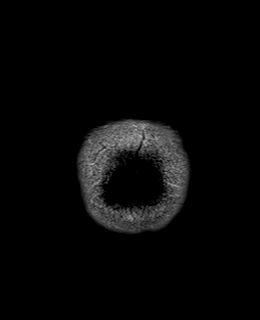

[48 of 48 positions shown; findings below may reference images not displayed]

FINDINGS: Brain: No acute infarction, hemorrhage, hydrocephalus, extra-axial
collection or mass lesion. No abnormal enhancement. Minimal chronic
microvascular ischemic changes and parenchymal volume loss for age.

Vascular: Normal flow voids.

Skull and upper cervical spine: Normal marrow signal.

Sinuses/Orbits: Negative.

Other: Choose
IMPRESSION: 1. No acute intracranial abnormality or abnormal enhancement.
2. Stable minimal chronic microvascular ischemic changes and
parenchymal volume loss of the brain for age. The

By: Davis Jim M.D.
